# Patient Record
Sex: Male | Born: 2000 | Race: Black or African American | Hispanic: No | Marital: Single | Smoking: Never smoker
Health system: Southern US, Community
[De-identification: ages and names within clinical notes are randomized; demographics above are authoritative.]

## PROBLEM LIST (undated history)

## (undated) DIAGNOSIS — I10 Essential (primary) hypertension: Secondary | ICD-10-CM

## (undated) DIAGNOSIS — E119 Type 2 diabetes mellitus without complications: Secondary | ICD-10-CM

## (undated) HISTORY — PX: OTHER SURGICAL HISTORY: SHX169

## (undated) HISTORY — DX: Type 2 diabetes mellitus without complications: E11.9

---
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
## 2019-07-29 ENCOUNTER — Other Ambulatory Visit: Payer: Self-pay

## 2019-07-29 ENCOUNTER — Inpatient Hospital Stay: Payer: 59

## 2019-07-29 ENCOUNTER — Emergency Department: Payer: 59

## 2019-07-29 ENCOUNTER — Encounter: Payer: Self-pay | Admitting: *Deleted

## 2019-07-29 ENCOUNTER — Inpatient Hospital Stay
Admission: EM | Admit: 2019-07-29 | Discharge: 2019-08-02 | DRG: 638 | Disposition: A | Discharge status: Home / Self Care | Payer: 59 | Attending: Internal Medicine | Admitting: Internal Medicine

## 2019-07-29 DIAGNOSIS — E1110 Type 2 diabetes mellitus with ketoacidosis without coma: Secondary | ICD-10-CM | POA: Diagnosis present

## 2019-07-29 DIAGNOSIS — I160 Hypertensive urgency: Secondary | ICD-10-CM | POA: Diagnosis not present

## 2019-07-29 DIAGNOSIS — D72829 Elevated white blood cell count, unspecified: Secondary | ICD-10-CM

## 2019-07-29 DIAGNOSIS — M6282 Rhabdomyolysis: Secondary | ICD-10-CM | POA: Diagnosis present

## 2019-07-29 DIAGNOSIS — E1310 Other specified diabetes mellitus with ketoacidosis without coma: Secondary | ICD-10-CM | POA: Diagnosis not present

## 2019-07-29 DIAGNOSIS — E871 Hypo-osmolality and hyponatremia: Secondary | ICD-10-CM | POA: Diagnosis present

## 2019-07-29 DIAGNOSIS — R7989 Other specified abnormal findings of blood chemistry: Secondary | ICD-10-CM

## 2019-07-29 DIAGNOSIS — N5082 Scrotal pain: Secondary | ICD-10-CM | POA: Diagnosis not present

## 2019-07-29 DIAGNOSIS — Z20822 Contact with and (suspected) exposure to covid-19: Secondary | ICD-10-CM | POA: Diagnosis present

## 2019-07-29 DIAGNOSIS — R531 Weakness: Secondary | ICD-10-CM

## 2019-07-29 DIAGNOSIS — E860 Dehydration: Secondary | ICD-10-CM | POA: Diagnosis present

## 2019-07-29 DIAGNOSIS — Z833 Family history of diabetes mellitus: Secondary | ICD-10-CM

## 2019-07-29 DIAGNOSIS — R748 Abnormal levels of other serum enzymes: Secondary | ICD-10-CM

## 2019-07-29 DIAGNOSIS — N179 Acute kidney failure, unspecified: Secondary | ICD-10-CM | POA: Diagnosis present

## 2019-07-29 DIAGNOSIS — Z880 Allergy status to penicillin: Secondary | ICD-10-CM

## 2019-07-29 DIAGNOSIS — I10 Essential (primary) hypertension: Secondary | ICD-10-CM | POA: Diagnosis present

## 2019-07-29 DIAGNOSIS — N50819 Testicular pain, unspecified: Secondary | ICD-10-CM

## 2019-07-29 DIAGNOSIS — K760 Fatty (change of) liver, not elsewhere classified: Secondary | ICD-10-CM | POA: Diagnosis present

## 2019-07-29 DIAGNOSIS — E1010 Type 1 diabetes mellitus with ketoacidosis without coma: Secondary | ICD-10-CM | POA: Diagnosis present

## 2019-07-29 LAB — URINALYSIS, COMPLETE (UACMP) WITH MICROSCOPIC
Bacteria, UA: NONE SEEN
Bilirubin Urine: NEGATIVE
Glucose, UA: >=500 mg/dL — AB
Ketones, ur: 20 mg/dL — AB
Leukocytes,Ua: NEGATIVE
Nitrite: NEGATIVE
Protein, ur: 100 mg/dL — AB
Specific Gravity, Urine: 1.022 (ref 1.005–1.030)
Squamous Epithelial / HPF: NONE SEEN (ref 0–5)
pH: 5 (ref 5.0–8.0)

## 2019-07-29 LAB — URINE DRUG SCREEN, QUALITATIVE (ARMC ONLY)
Amphetamines, Ur Screen: NOT DETECTED
Barbiturates, Ur Screen: NOT DETECTED
Benzodiazepine, Ur Scrn: NOT DETECTED
Cannabinoid 50 Ng, Ur ~~LOC~~: NOT DETECTED
Cocaine Metabolite,Ur ~~LOC~~: NOT DETECTED
MDMA (Ecstasy)Ur Screen: NOT DETECTED
Methadone Scn, Ur: NOT DETECTED
Opiate, Ur Screen: NOT DETECTED
Phencyclidine (PCP) Ur S: NOT DETECTED
Tricyclic, Ur Screen: NOT DETECTED

## 2019-07-29 LAB — CBC
HCT: 56.9 % — ABNORMAL HIGH (ref 39.0–52.0)
Hemoglobin: 18.4 g/dL — ABNORMAL HIGH (ref 13.0–17.0)
MCH: 24.6 pg — ABNORMAL LOW (ref 26.0–34.0)
MCHC: 32.3 g/dL (ref 30.0–36.0)
MCV: 76.2 fL — ABNORMAL LOW (ref 80.0–100.0)
Platelets: 453 10*3/uL — ABNORMAL HIGH (ref 150–400)
RBC: 7.47 MIL/uL — ABNORMAL HIGH (ref 4.22–5.81)
RDW: 16.5 % — ABNORMAL HIGH (ref 11.5–15.5)
WBC: 17.9 10*3/uL — ABNORMAL HIGH (ref 4.0–10.5)
nRBC: 0 % (ref 0.0–0.2)

## 2019-07-29 LAB — BLOOD GAS, VENOUS
Acid-base deficit: 18.5 mmol/L — ABNORMAL HIGH (ref 0.0–2.0)
Bicarbonate: 9.3 mmol/L — ABNORMAL LOW (ref 20.0–28.0)
O2 Saturation: 73.8 %
Patient temperature: 37
pCO2, Ven: 28 mmHg — ABNORMAL LOW (ref 44.0–60.0)
pH, Ven: 7.13 — CL (ref 7.250–7.430)
pO2, Ven: 53 mmHg — ABNORMAL HIGH (ref 32.0–45.0)

## 2019-07-29 LAB — HEPATIC FUNCTION PANEL
ALT: 123 U/L — ABNORMAL HIGH (ref 0–44)
AST: 321 U/L — ABNORMAL HIGH (ref 15–41)
Albumin: 4.4 g/dL (ref 3.5–5.0)
Alkaline Phosphatase: 94 U/L (ref 38–126)
Bilirubin, Direct: 0.4 mg/dL — ABNORMAL HIGH (ref 0.0–0.2)
Indirect Bilirubin: 2 mg/dL — ABNORMAL HIGH (ref 0.3–0.9)
Total Bilirubin: 2.4 mg/dL — ABNORMAL HIGH (ref 0.3–1.2)
Total Protein: 8.1 g/dL (ref 6.5–8.1)

## 2019-07-29 LAB — GLUCOSE, CAPILLARY: Glucose-Capillary: >600 mg/dL (ref 70–99)

## 2019-07-29 LAB — BASIC METABOLIC PANEL
Anion gap: 32 — ABNORMAL HIGH (ref 5–15)
BUN: 36 mg/dL — ABNORMAL HIGH (ref 6–20)
CO2: 9 mmol/L — ABNORMAL LOW (ref 22–32)
Calcium: 9.7 mg/dL (ref 8.9–10.3)
Chloride: 89 mmol/L — ABNORMAL LOW (ref 98–111)
Creatinine, Ser: 2.39 mg/dL — ABNORMAL HIGH (ref 0.61–1.24)
GFR calc Af Amer: 44 mL/min — ABNORMAL LOW (ref >60)
GFR calc non Af Amer: 38 mL/min — ABNORMAL LOW (ref >60)
Glucose, Bld: 910 mg/dL (ref 70–99)
Potassium: 6 mmol/L — ABNORMAL HIGH (ref 3.5–5.1)
Sodium: 130 mmol/L — ABNORMAL LOW (ref 135–145)

## 2019-07-29 LAB — SARS CORONAVIRUS 2 BY RT PCR (HOSPITAL ORDER, PERFORMED IN ~~LOC~~ HOSPITAL LAB): SARS Coronavirus 2: NEGATIVE

## 2019-07-29 LAB — CK: Total CK: 45235 U/L — ABNORMAL HIGH (ref 49–397)

## 2019-07-29 LAB — BETA-HYDROXYBUTYRIC ACID: Beta-Hydroxybutyric Acid: >8 mmol/L — ABNORMAL HIGH (ref 0.05–0.27)

## 2019-07-29 MED ORDER — DEXTROSE-NACL 5-0.45 % IV SOLN: INTRAVENOUS | Status: DC

## 2019-07-29 MED ORDER — LACTATED RINGERS IV BOLUS
1000.0000 mL | Freq: Once | INTRAVENOUS | Status: AC
  Administered 2019-07-29: 1000 mL via INTRAVENOUS

## 2019-07-29 MED ORDER — DEXTROSE IN LACTATED RINGERS 5 % IV SOLN: INTRAVENOUS | Status: DC

## 2019-07-29 MED ORDER — LACTATED RINGERS IV SOLN: INTRAVENOUS | Status: DC

## 2019-07-29 MED ORDER — LACTATED RINGERS BOLUS PEDS
1000.0000 mL | Freq: Once | INTRAVENOUS | Status: AC
  Administered 2019-07-29: 1000 mL via INTRAVENOUS

## 2019-07-29 MED ORDER — SODIUM CHLORIDE 0.9% FLUSH: 3.0000 mL | Freq: Once | INTRAVENOUS | Status: DC

## 2019-07-29 MED ORDER — HEPARIN SODIUM (PORCINE) 5000 UNIT/ML IJ SOLN
5000.0000 [IU] | Freq: Three times a day (TID) | INTRAMUSCULAR | Status: DC
  Administered 2019-07-30 – 2019-08-02 (×11): 5000 [IU] via SUBCUTANEOUS
  Filled 2019-07-29 (×9): qty 1

## 2019-07-29 MED ORDER — STERILE WATER FOR INJECTION IV SOLN
INTRAVENOUS | Status: DC
  Filled 2019-07-29 (×2): qty 850

## 2019-07-29 MED ORDER — SODIUM CHLORIDE 0.9 % IV BOLUS
1000.0000 mL | Freq: Once | INTRAVENOUS | Status: AC
  Administered 2019-07-29: 1000 mL via INTRAVENOUS

## 2019-07-29 MED ORDER — INSULIN REGULAR(HUMAN) IN NACL 100-0.9 UT/100ML-% IV SOLN
INTRAVENOUS | Status: DC
  Administered 2019-07-29: 9.5 [IU]/h via INTRAVENOUS
  Administered 2019-07-30: 12 [IU]/h via INTRAVENOUS
  Administered 2019-07-30: 13 [IU]/h via INTRAVENOUS
  Filled 2019-07-29 (×5): qty 100

## 2019-07-29 MED ORDER — DEXTROSE 50 % IV SOLN: 0.0000 mL | INTRAVENOUS | Status: DC | PRN

## 2019-07-29 NOTE — ED Notes (Signed)
Pt taken to the bathroom for specimen.

## 2019-07-29 NOTE — ED Notes (Signed)
Pt brought from [facility] for fatigue and vomiting. Pt states he has been drinking 5 gallons of water every 2 days for 3 to 5 weeks. Heart rate is 148, sats 98%, BP 140/98.

## 2019-07-29 NOTE — H&P (Signed)
Name: Kevin Sanders MRN: [id number] DOB: March 30, 2000    ADMISSION DATE:  07/29/2019 CONSULTATION DATE:  07/29/2019  REFERRING MD :  Dr. Joan Mayans  CHIEF COMPLAINT:  Fatigue, Polyuria, Polydipsia  BRIEF PATIENT DESCRIPTION:  19 y.o. Male admitted 07/29/19 with new onset Diabetes Mellitus & DKA, AKI, and Rhabdomyolysis requiring insulin and bicarb drips.  SIGNIFICANT EVENTS  5/20: Admission to Stepdown unit  STUDIES:  5/20: RUQ Abdominal US>> 1. Increased hepatic echogenicity consistent with steatosis. The liver parenchyma is difficult to penetrate, no obvious focal abnormality. 2. Normal sonographic appearance of the gallbladder and biliary Tree. 5/20: CXR>>The cardiomediastinal contours are normal. The lungs are clear. Pulmonary vasculature is normal. No consolidation, pleural effusion, or pneumothorax. No acute osseous abnormalities are seen.  CULTURES: SARS-CoV-2 PCR 5/20>> negative  ANTIBIOTICS: N/A  HISTORY OF PRESENT ILLNESS:   Kevin Sanders is a 19 year old male with a past medical history significant for hypertension who presents to [facility] ED on 07/29/2019 due to complaints of fatigue, vomiting, polydipsia, and polyuria.  He reports his symptoms have been ongoing for the last several weeks, had progressively worsening.  Reports he has been drinking almost 5 gallons of water a day.  Patient has no known history of diabetes, mom has type 2 diabetes, and grandfather has type 1 diabetes.  Upon presentation to the ED he was noted to be tachycardic, all other vital signs within normal limits.  Initial work-up in the ED revealed glucose 910, beta hydroxybutyric acid >8, bicarb 9, potassium 6, BUN 36, creatinine 2.39, CK 45,235, sodium 130.  Venous blood gas with pH 7.13, PCO2 28, PO2 53, bicarb 9.3.  Urinalysis positive for ketones and hemoglobin, but no RBC's.  WBC 17.9, AST 321, ALT 123, direct bilirubin 0.4.  Chest x-ray without any acute cardiopulmonary disease.  Right upper  quadrant abdominal ultrasound with hepatic steatosis, otherwise normal gallbladder and biliary tree.  Work-up is consistent for DKA, therefore he received 2 L IV fluid bolus and was placed on insulin drip.  PCCM is asked to admit the patient for further work-up and treatment of new onset diabetes mellitus with DKA, AKI, and rhabdomyolysis requiring insulin and bicarb drips.  PAST MEDICAL HISTORY :   has a past medical history of Hypertension.  has a past surgical history that includes none. Prior to Admission medications   Medication Sig Start Date End Date Taking? Authorizing Provider  cetirizine (ZYRTEC) 10 MG tablet Take by mouth.    [provider]   Allergies  Allergen Reactions  . Augmentin [Amoxicillin-Pot Clavulanate]     FAMILY HISTORY:  family history is not on file. SOCIAL HISTORY:  reports that he has never smoked. He has never used smokeless tobacco. He reports that he does not drink alcohol or use drugs.   COVID-19 DISASTER DECLARATION:  FULL CONTACT PHYSICAL EXAMINATION WAS NOT POSSIBLE DUE TO TREATMENT OF COVID-19 AND  CONSERVATION OF PERSONAL PROTECTIVE EQUIPMENT, LIMITED EXAM FINDINGS INCLUDE-  Patient assessed or the symptoms described in the history of present illness.  In the context of the Global COVID-19 pandemic, which necessitated consideration that the patient might be at risk for infection with the SARS-CoV-2 virus that causes COVID-19, Institutional protocols and algorithms that pertain to the evaluation of patients at risk for COVID-19 are in a state of rapid change based on information released by regulatory bodies including the CDC and federal and state organizations. These policies and algorithms were followed during the patient's care while in hospital.  REVIEW OF SYSTEMS:  Positives in BOLD: Patient currently denies all complaints Constitutional: Negative for fever, chills, weight loss, malaise/fatigue and diaphoresis.  HENT: Negative for  hearing loss, ear pain, nosebleeds, congestion, sore throat, neck pain, tinnitus and ear discharge.   Eyes: Negative for blurred vision, double vision, photophobia, pain, discharge and redness.  Respiratory: Negative for cough, hemoptysis, sputum production, shortness of breath, wheezing and stridor.   Cardiovascular: Negative for chest pain, palpitations, orthopnea, claudication, leg swelling and PND.  Gastrointestinal: Negative for heartburn, nausea, vomiting, abdominal pain, diarrhea, constipation, blood in stool and melena.  Genitourinary: Negative for dysuria, urgency, frequency, hematuria and flank pain.  Musculoskeletal: Negative for myalgias, back pain, joint pain and falls.  Skin: Negative for itching and rash.  Neurological: Negative for dizziness, tingling, tremors, sensory change, speech change, focal weakness, seizures, loss of consciousness, weakness and headaches.  Endo/Heme/Allergies: Negative for environmental allergies and polydipsia. Does not bruise/bleed easily.  SUBJECTIVE:  Patient denies chest pain, shortness of breath, abdominal pain, nausea, vomiting, diarrhea, fever, chills Patient reports he is hungry would like to eat On room air  VITAL SIGNS: Temp:  [98 F (36.7 C)] 98 F (36.7 C) (05/20 1541) Pulse Rate:  [130-150] 137 (05/20 1900) Resp:  [24-36] 33 (05/20 1900) BP: (130-169)/(30-128) 169/128 (05/20 1900) SpO2:  [98 %] 98 % (05/20 1900) Weight:  [116.6 kg] 116.6 kg (05/20 1543)  PHYSICAL EXAMINATION: General: Acutely ill-appearing obese male, laying in bed, on room air, no acute distress Neuro: Awake, alert and oriented x4, follows commands, no focal deficits, speech clear HEENT: Atraumatic, normocephalic, neck supple, no JVD Cardiovascular: Tachycardia, regular rhythm, S1-S2, no murmurs, rubs, gallops, 2+ pulses throughout Lungs: Clear to auscultation bilaterally, even, nonlabored, normal effort Abdomen: Obese, soft, nontender, nondistended, no guarding  or rebound tenderness, bowel sounds positive x4 Musculoskeletal: Normal bulk and tone, no deformities, no edema Skin: Warm and dry.  No obvious rashes, lesions, ulcerations  Recent Labs  Lab 07/29/19 1550  NA 130*  K 6.0*  CL 89*  CO2 9*  BUN 36*  CREATININE 2.39*  GLUCOSE 910*   Recent Labs  Lab 07/29/19 1550  HGB 18.4*  HCT 56.9*  WBC 17.9*  PLT 453*   DG Chest Port 1 View  Result Date: 07/29/2019 CLINICAL DATA:  Leukocytosis with new onset diabetes and DKA. Fatigue and vomiting. EXAM: PORTABLE CHEST 1 VIEW COMPARISON:  None. FINDINGS: The cardiomediastinal contours are normal. The lungs are clear. Pulmonary vasculature is normal. No consolidation, pleural effusion, or pneumothorax. No acute osseous abnormalities are seen. IMPRESSION: Negative portable AP view of the chest. Electronically Signed   By: Narda Rutherford M.D.   On: 07/29/2019 20:08   US ABDOMEN LIMITED RUQ  Result Date: 07/29/2019 CLINICAL DATA:  19 year old with elevated LFTs. EXAM: ULTRASOUND ABDOMEN LIMITED RIGHT UPPER QUADRANT COMPARISON:  Noncontrast CT 01/17/2017 FINDINGS: Gallbladder: Physiologically distended. No gallstones or wall thickening visualized. No sonographic Murphy sign noted by sonographer. Common bile duct: Diameter: 4 mm, normal. Liver: Diffusely heterogeneous and increased hepatic parenchymal echogenicity. The liver parenchyma is difficult to penetrate. No obvious focal lesion. Portal vein is patent on color Doppler imaging with normal direction of blood flow towards the liver. Other: No right upper quadrant ascites. IMPRESSION: 1. Increased hepatic echogenicity consistent with steatosis. The liver parenchyma is difficult to penetrate, no obvious focal abnormality. 2. Normal sonographic appearance of the gallbladder and biliary tree. Electronically Signed   By: Narda Rutherford M.D.   On: 07/29/2019 18:40    ASSESSMENT / PLAN:  DKA  New onset Diabetes Mellitus -Follow DKA Protocol -Aggressive  IVF -Insulin drip -Follow BMP q4h -Once Anion gap closed and serum Bicarb >20, can convert to Long acting insulin & SSI -Consult diabetes coordinator, appreciate input -Check Hgb A1c -Check Islet cell antibody, Serum C-peptide & Insulin  AKI Rhabdomyolysis Hypertonic Hyponatremia (Corrected Na given glucose is 143) -Monitor I&O's / urinary output -Follow BMP -Ensure adequate renal perfusion -Avoid nephrotoxic agents as able -Replace electrolytes as indicated -IV fluids -Bicarb gtt -Follow CK  Leukocytosis, likely reactive -Monitor fever curve -Trend WBC's -Urinalysis and CXR both negative  Elevated LFT's H-epatic Steatosis on Abdominal Ultrasound, normal gallbladder and biliary tree -Follow LFT's     BEST PRACTICES: DISPOSITION: Stepdown GOALS OF CARE: FULL CODE VTE PROPHYLAXIS: HEPARIN SQ CONSULTS: DIABETES COORDINATOR UPDATES: UPDATED PT AND HIS MOTHER AT BEDSIDE 07/29/19  Harlon Ditty, AGACNP-BC [facility] Pager: [phone]  07/29/2019, 8:12 PM

## 2019-07-29 NOTE — ED Provider Notes (Signed)
[facility] Emergency Department Provider Note  ____________________________________________   First MD Initiated Contact with Patient 07/29/19 1645     (approximate)  I have reviewed the triage vital signs and the nursing notes.  History  Chief Complaint Fatigue    HPI HARVEER SADLER is a 19 y.o. male with history of hypertension who presents to the emergency department for fatigue, vomiting, polydipsia, polyuria.  Patient states his generalized fatigue and increased thirst and urination have been ongoing for the last several weeks, up to a month.  Constant since onset and progressively worsening.  Says he is drinking almost 5 gallons of water a day.  Over the last week he has also had intermittent vomiting, nonbloody.  Also reports some vague chest soreness since doing heavy workout 3 days ago.  This has been constant, mild to moderate in severity, no radiation, no alleviating/aggravating components.  Mom has a history of type 2 diabetes.  Patient has no known prior history of diabetes.   Past Medical Hx Past Medical History:  Diagnosis Date  . Hypertension     Problem List There are no problems to display for this patient.   Past Surgical Hx Past Surgical History:  Procedure Laterality Date  . none      Medications Prior to Admission medications   Medication Sig Start Date End Date Taking? Authorizing Provider  amLODipine (NORVASC) 10 MG tablet Take 10 mg by mouth daily. 12/31/16   [provider]  lisinopril-hydrochlorothiazide (PRINZIDE,ZESTORETIC) 20-12.5 MG tablet TAKE 1 TAB BY MOUTH ONCE A DAY 12/31/16   [provider]  sulfamethoxazole-trimethoprim (BACTRIM DS,SEPTRA DS) 800-160 MG tablet Take 1 tablet by mouth 2 (two) times daily.    [provider]    Allergies Augmentin [amoxicillin-pot clavulanate]  Family Hx Family History  Problem Relation Age of Onset  . Bladder Cancer Neg Hx   . Kidney cancer  Neg Hx   . Prostate cancer Neg Hx     Social Hx Social History   Tobacco Use  . Smoking status: Never Smoker  . Smokeless tobacco: Never Used  Substance Use Topics  . Alcohol use: No  . Drug use: No     Review of Systems  Constitutional: Negative for fever. Negative for chills.  Positive for fatigue. Eyes: Negative for visual changes. ENT: Negative for sore throat. Cardiovascular: Positive for chest pain. Respiratory: Negative for shortness of breath. Gastrointestinal: Positive for vomiting. Genitourinary: Negative for dysuria. Musculoskeletal: Negative for leg swelling. Skin: Negative for rash. Neurological: Negative for headaches. Endocrine: Positive for polyuria, polydipsia.   Physical Exam  Vital Signs: ED Triage Vitals  Enc Vitals Group     BP 07/29/19 1541 (!) 130/30     Pulse Rate 07/29/19 1541 (!) 150     Resp 07/29/19 1541 (!) 24     Temp 07/29/19 1541 98 F (36.7 C)     Temp Source 07/29/19 1541 Oral     SpO2 07/29/19 1541 98 %     Weight 07/29/19 1543 257 lb (116.6 kg)     Height --      Head Circumference --      Peak Flow --      Pain Score 07/29/19 1543 0     Pain Loc --      Pain Edu? --      Excl. in [place]? --     Constitutional: Alert and oriented.  Overweight.  Head: Normocephalic. Atraumatic. Eyes: Conjunctivae clear. Sclera anicteric. Pupils equal  and symmetric. Nose: No masses or lesions. No congestion or rhinorrhea. Mouth/Throat: Wearing mask.  MM very dry. Neck: No stridor. Trachea midline.  Cardiovascular: Tachycardic, regular rhythm. Extremities well perfused. Respiratory: Tachypneic.  Lungs CTAB. Gastrointestinal: Soft. Non-distended. Non-tender.  Genitourinary: Deferred. Musculoskeletal: No lower extremity edema. No deformities. Neurologic:  Normal speech and language. No gross focal or lateralizing neurologic deficits are appreciated.  Skin: Skin is warm, dry and intact. No rash noted. Psychiatric: Mood and affect are  appropriate for situation.  EKG  Personally reviewed and interpreted by myself.   Date: 07/29/2019 Time: 146 Rate: 1537 Rhythm: Sinus Axis: Normal Intervals: Within normal limits Sinus tachycardia No STEMI    Radiology  Personally reviewed available imaging myself.   RUQ US - IMPRESSION:  1. Increased hepatic echogenicity consistent with steatosis. The  liver parenchyma is difficult to penetrate, no obvious focal  abnormality.  2. Normal sonographic appearance of the gallbladder and biliary  tree.    Procedures  Procedure(s) performed (including critical care):  .Critical Care Performed by: Miguel Aschoff., MD Authorized by: Miguel Aschoff., MD   Critical care provider statement:    Critical care time (minutes):  45   Critical care was necessary to treat or prevent imminent or life-threatening deterioration of the following conditions:  Endocrine crisis   Critical care was time spent personally by me on the following activities:  Discussions with consultants, evaluation of patient's response to treatment, examination of patient, ordering and performing treatments and interventions, ordering and review of laboratory studies, ordering and review of radiographic studies, pulse oximetry, re-evaluation of patient's condition, obtaining history from patient or surrogate and review of old charts     Initial Impression / Assessment and Plan / MDM / ED Course  19 y.o. male who presents to the ED for 4 weeks of fatigue, polyuria, polydipsia, and 1 week of vomiting and dehydration. POC glucose on arrival greater than 600, concerning for new onset diabetes, especially DKA versus HHS.  History also concerning for potential rhabdo, as patient is complaining of muscle aches after an intense workout several days ago.  We will obtain labs, initiate fluid resuscitation, plan for insulin pending potassium level.  Labs consistent with DKA.  Labs obtained reveal leukocytosis to 17.9,  suspect reactive.  Glucose on BMP 910.  Creatinine 2.39.  Bicarb 9.  Anion gap 32.  VBG pH 7.13.  Beta hydroxybutyrate greater than 8.  Ketones in urine.  Labs also concerning for rhabdomyolysis.  Large hemoglobin in urine, no RBCs, CK 45,000.  We will initiate aggressive fluid resuscitation, insulin drip.    Other labs also reveal elevated LFTs and bilirubin, which may reactive in setting of his DKA, vomiting, dehydration, rhabdo, additionally the sample hemolyzed so may be falsely elevated.  Will resend sample, but also obtain RUQ ultrasound to rule out any potential infectious etiology as an additional precipitant for his DKA.  Discussed with ICU for admission.  Updated mother and patient at bedside on results, who are in agreement with the plan.  _______________________________   As part of my medical decision making I have reviewed available labs, radiology tests, reviewed old records/performed chart review, obtained additional history from family, mom at bedside    Final Clinical Impression(s) / ED Diagnosis  DKA Dehydration Vomiting Weakness Elevated LFTs Rhabdomyolysis Elevated CK AKI Metabolic acidosis    Note:  This document was prepared using Dragon voice recognition software and may include unintentional dictation errors.   Miguel Aschoff., MD 07/29/19 2019

## 2019-07-29 NOTE — ED Triage Notes (Signed)
Pt was seen at [facility] for not feeling well including fatigue. Pt states that he has been drinking 5 gallons of water daily. Pt reports that his chest has been hurting since he worked out 3 days ago.

## 2019-07-30 DIAGNOSIS — E1010 Type 1 diabetes mellitus with ketoacidosis without coma: Principal | ICD-10-CM

## 2019-07-30 DIAGNOSIS — M6282 Rhabdomyolysis: Secondary | ICD-10-CM

## 2019-07-30 DIAGNOSIS — E1310 Other specified diabetes mellitus with ketoacidosis without coma: Secondary | ICD-10-CM

## 2019-07-30 LAB — BASIC METABOLIC PANEL WITH GFR
Anion gap: 10 (ref 5–15)
Anion gap: 12 (ref 5–15)
Anion gap: 13 (ref 5–15)
BUN: 27 mg/dL — ABNORMAL HIGH (ref 6–20)
BUN: 24 mg/dL — ABNORMAL HIGH (ref 6–20)
BUN: 23 mg/dL — ABNORMAL HIGH (ref 6–20)
CO2: 27 mmol/L (ref 22–32)
CO2: 22 mmol/L (ref 22–32)
CO2: 21 mmol/L — ABNORMAL LOW (ref 22–32)
Calcium: 9.2 mg/dL (ref 8.9–10.3)
Calcium: 8.7 mg/dL — ABNORMAL LOW (ref 8.9–10.3)
Calcium: 8.7 mg/dL — ABNORMAL LOW (ref 8.9–10.3)
Chloride: 105 mmol/L (ref 98–111)
Chloride: 105 mmol/L (ref 98–111)
Chloride: 101 mmol/L (ref 98–111)
Creatinine, Ser: 1.3 mg/dL — ABNORMAL HIGH (ref 0.61–1.24)
Creatinine, Ser: 1.02 mg/dL (ref 0.61–1.24)
Creatinine, Ser: 1.01 mg/dL (ref 0.61–1.24)
GFR calc Af Amer: >60 mL/min
GFR calc Af Amer: >60 mL/min
GFR calc Af Amer: >60 mL/min
GFR calc non Af Amer: >60 mL/min
GFR calc non Af Amer: >60 mL/min
GFR calc non Af Amer: >60 mL/min
Glucose, Bld: 201 mg/dL — ABNORMAL HIGH (ref 70–99)
Glucose, Bld: 223 mg/dL — ABNORMAL HIGH (ref 70–99)
Glucose, Bld: 324 mg/dL — ABNORMAL HIGH (ref 70–99)
Potassium: 4 mmol/L (ref 3.5–5.1)
Potassium: 3.8 mmol/L (ref 3.5–5.1)
Potassium: 4.1 mmol/L (ref 3.5–5.1)
Sodium: 142 mmol/L (ref 135–145)
Sodium: 139 mmol/L (ref 135–145)
Sodium: 135 mmol/L (ref 135–145)

## 2019-07-30 LAB — CBC
HCT: 50.7 % (ref 39.0–52.0)
Hemoglobin: 17.8 g/dL — ABNORMAL HIGH (ref 13.0–17.0)
MCH: 25.1 pg — ABNORMAL LOW (ref 26.0–34.0)
MCHC: 35.1 g/dL (ref 30.0–36.0)
MCV: 71.4 fL — ABNORMAL LOW (ref 80.0–100.0)
Platelets: 336 10*3/uL (ref 150–400)
RBC: 7.1 MIL/uL — ABNORMAL HIGH (ref 4.22–5.81)
RDW: 16.2 % — ABNORMAL HIGH (ref 11.5–15.5)
WBC: 18.9 10*3/uL — ABNORMAL HIGH (ref 4.0–10.5)
nRBC: 0 % (ref 0.0–0.2)

## 2019-07-30 LAB — GLUCOSE, CAPILLARY
Glucose-Capillary: 164 mg/dL — ABNORMAL HIGH (ref 70–99)
Glucose-Capillary: 194 mg/dL — ABNORMAL HIGH (ref 70–99)
Glucose-Capillary: 194 mg/dL — ABNORMAL HIGH (ref 70–99)
Glucose-Capillary: 208 mg/dL — ABNORMAL HIGH (ref 70–99)
Glucose-Capillary: 210 mg/dL — ABNORMAL HIGH (ref 70–99)
Glucose-Capillary: 216 mg/dL — ABNORMAL HIGH (ref 70–99)
Glucose-Capillary: 218 mg/dL — ABNORMAL HIGH (ref 70–99)
Glucose-Capillary: 219 mg/dL — ABNORMAL HIGH (ref 70–99)
Glucose-Capillary: 269 mg/dL — ABNORMAL HIGH (ref 70–99)
Glucose-Capillary: 276 mg/dL — ABNORMAL HIGH (ref 70–99)
Glucose-Capillary: 281 mg/dL — ABNORMAL HIGH (ref 70–99)
Glucose-Capillary: 289 mg/dL — ABNORMAL HIGH (ref 70–99)
Glucose-Capillary: 297 mg/dL — ABNORMAL HIGH (ref 70–99)
Glucose-Capillary: 299 mg/dL — ABNORMAL HIGH (ref 70–99)
Glucose-Capillary: 311 mg/dL — ABNORMAL HIGH (ref 70–99)
Glucose-Capillary: 318 mg/dL — ABNORMAL HIGH (ref 70–99)
Glucose-Capillary: 345 mg/dL — ABNORMAL HIGH (ref 70–99)
Glucose-Capillary: 351 mg/dL — ABNORMAL HIGH (ref 70–99)
Glucose-Capillary: 354 mg/dL — ABNORMAL HIGH (ref 70–99)
Glucose-Capillary: 436 mg/dL — ABNORMAL HIGH (ref 70–99)
Glucose-Capillary: 456 mg/dL — ABNORMAL HIGH (ref 70–99)
Glucose-Capillary: 489 mg/dL — ABNORMAL HIGH (ref 70–99)
Glucose-Capillary: 493 mg/dL — ABNORMAL HIGH (ref 70–99)
Glucose-Capillary: 497 mg/dL — ABNORMAL HIGH (ref 70–99)
Glucose-Capillary: >600 mg/dL (ref 70–99)
Glucose-Capillary: >600 mg/dL (ref 70–99)
Glucose-Capillary: >600 mg/dL (ref 70–99)
Glucose-Capillary: >600 mg/dL (ref 70–99)

## 2019-07-30 LAB — HEPATIC FUNCTION PANEL
ALT: 105 U/L — ABNORMAL HIGH (ref 0–44)
AST: 350 U/L — ABNORMAL HIGH (ref 15–41)
Albumin: 3.6 g/dL (ref 3.5–5.0)
Alkaline Phosphatase: 74 U/L (ref 38–126)
Bilirubin, Direct: 0.3 mg/dL — ABNORMAL HIGH (ref 0.0–0.2)
Indirect Bilirubin: 1.2 mg/dL — ABNORMAL HIGH (ref 0.3–0.9)
Total Bilirubin: 1.5 mg/dL — ABNORMAL HIGH (ref 0.3–1.2)
Total Protein: 7 g/dL (ref 6.5–8.1)

## 2019-07-30 LAB — BETA-HYDROXYBUTYRIC ACID
Beta-Hydroxybutyric Acid: 1.25 mmol/L — ABNORMAL HIGH (ref 0.05–0.27)
Beta-Hydroxybutyric Acid: 3.99 mmol/L — ABNORMAL HIGH (ref 0.05–0.27)

## 2019-07-30 LAB — BASIC METABOLIC PANEL
Anion gap: 21 — ABNORMAL HIGH (ref 5–15)
Anion gap: 11 (ref 5–15)
BUN: 33 mg/dL — ABNORMAL HIGH (ref 6–20)
BUN: 22 mg/dL — ABNORMAL HIGH (ref 6–20)
CO2: 16 mmol/L — ABNORMAL LOW (ref 22–32)
CO2: 21 mmol/L — ABNORMAL LOW (ref 22–32)
Calcium: 9.2 mg/dL (ref 8.9–10.3)
Calcium: 9 mg/dL (ref 8.9–10.3)
Chloride: 102 mmol/L (ref 98–111)
Chloride: 103 mmol/L (ref 98–111)
Creatinine, Ser: 1.69 mg/dL — ABNORMAL HIGH (ref 0.61–1.24)
Creatinine, Ser: 1.08 mg/dL (ref 0.61–1.24)
GFR calc Af Amer: >60 mL/min (ref >60)
GFR calc Af Amer: >60 mL/min (ref >60)
GFR calc non Af Amer: 57 mL/min — ABNORMAL LOW (ref >60)
GFR calc non Af Amer: >60 mL/min (ref >60)
Glucose, Bld: 402 mg/dL — ABNORMAL HIGH (ref 70–99)
Glucose, Bld: 291 mg/dL — ABNORMAL HIGH (ref 70–99)
Potassium: 4.5 mmol/L (ref 3.5–5.1)
Potassium: 4.1 mmol/L (ref 3.5–5.1)
Sodium: 139 mmol/L (ref 135–145)
Sodium: 135 mmol/L (ref 135–145)

## 2019-07-30 LAB — CK: Total CK: 51785 U/L — ABNORMAL HIGH (ref 49–397)

## 2019-07-30 LAB — HEMOGLOBIN A1C
Hgb A1c MFr Bld: 11.4 % — ABNORMAL HIGH (ref 4.8–5.6)
Mean Plasma Glucose: 280 mg/dL

## 2019-07-30 LAB — MRSA PCR SCREENING: MRSA by PCR: NEGATIVE

## 2019-07-30 LAB — HIV ANTIBODY (ROUTINE TESTING W REFLEX): HIV Screen 4th Generation wRfx: NONREACTIVE

## 2019-07-30 MED ORDER — INSULIN ASPART 100 UNIT/ML ~~LOC~~ SOLN: 0.0000 [IU] | Freq: Every day | SUBCUTANEOUS | Status: DC

## 2019-07-30 MED ORDER — KETOROLAC TROMETHAMINE 30 MG/ML IJ SOLN
30.0000 mg | Freq: Once | INTRAMUSCULAR | Status: AC
  Administered 2019-07-30: 30 mg via INTRAVENOUS
  Filled 2019-07-30: qty 1

## 2019-07-30 MED ORDER — LACTATED RINGERS IV SOLN: INTRAVENOUS | Status: DC

## 2019-07-30 MED ORDER — INSULIN ASPART 100 UNIT/ML ~~LOC~~ SOLN
4.0000 [IU] | Freq: Three times a day (TID) | SUBCUTANEOUS | Status: DC
  Administered 2019-07-31: 4 [IU] via SUBCUTANEOUS
  Filled 2019-07-30: qty 1

## 2019-07-30 MED ORDER — INSULIN ASPART 100 UNIT/ML ~~LOC~~ SOLN
0.0000 [IU] | Freq: Three times a day (TID) | SUBCUTANEOUS | Status: DC
  Administered 2019-07-30: 11 [IU] via SUBCUTANEOUS
  Administered 2019-07-30: 5 [IU] via SUBCUTANEOUS
  Filled 2019-07-30 (×2): qty 1

## 2019-07-30 MED ORDER — INSULIN DETEMIR 100 UNIT/ML ~~LOC~~ SOLN
34.0000 [IU] | Freq: Every day | SUBCUTANEOUS | Status: DC
  Administered 2019-07-30: 34 [IU] via SUBCUTANEOUS
  Filled 2019-07-30 (×2): qty 0.34

## 2019-07-30 MED ORDER — LABETALOL HCL 5 MG/ML IV SOLN
10.0000 mg | INTRAVENOUS | Status: DC | PRN
  Administered 2019-07-30 – 2019-07-31 (×6): 10 mg via INTRAVENOUS
  Filled 2019-07-30 (×7): qty 4

## 2019-07-30 MED ORDER — INSULIN ASPART 100 UNIT/ML ~~LOC~~ SOLN
0.0000 [IU] | Freq: Every day | SUBCUTANEOUS | Status: DC
  Administered 2019-07-30: 2 [IU] via SUBCUTANEOUS
  Administered 2019-07-31 – 2019-08-01 (×2): 3 [IU] via SUBCUTANEOUS
  Filled 2019-07-30 (×3): qty 1

## 2019-07-30 MED ORDER — LIVING WELL WITH DIABETES BOOK
Freq: Once | Status: AC
  Filled 2019-07-30: qty 1

## 2019-07-30 MED ORDER — INSULIN DETEMIR 100 UNIT/ML ~~LOC~~ SOLN
0.3000 [IU]/kg | SUBCUTANEOUS | Status: DC
  Administered 2019-07-30: 34 [IU] via SUBCUTANEOUS
  Filled 2019-07-30 (×2): qty 0.34

## 2019-07-30 MED ORDER — KETOROLAC TROMETHAMINE 15 MG/ML IJ SOLN
15.0000 mg | Freq: Four times a day (QID) | INTRAMUSCULAR | Status: DC | PRN
  Administered 2019-07-31 – 2019-08-01 (×5): 15 mg via INTRAVENOUS
  Filled 2019-07-30 (×6): qty 1

## 2019-07-30 MED ORDER — INSULIN STARTER KIT- PEN NEEDLES (ENGLISH)
1.0000 | Freq: Once | Status: AC
  Administered 2019-07-30: 1
  Filled 2019-07-30: qty 1

## 2019-07-30 MED ORDER — INSULIN ASPART 100 UNIT/ML ~~LOC~~ SOLN
0.0000 [IU] | Freq: Three times a day (TID) | SUBCUTANEOUS | Status: DC
  Administered 2019-07-31: 8 [IU] via SUBCUTANEOUS
  Administered 2019-07-31: 11 [IU] via SUBCUTANEOUS
  Administered 2019-07-31: 8 [IU] via SUBCUTANEOUS
  Administered 2019-08-01 (×3): 5 [IU] via SUBCUTANEOUS
  Administered 2019-08-02 (×2): 3 [IU] via SUBCUTANEOUS
  Filled 2019-07-30 (×8): qty 1

## 2019-07-30 MED ORDER — CHLORHEXIDINE GLUCONATE CLOTH 2 % EX PADS: 6.0000 | MEDICATED_PAD | Freq: Every day | CUTANEOUS | Status: DC

## 2019-07-30 MED ORDER — INSULIN ASPART 100 UNIT/ML ~~LOC~~ SOLN
4.0000 [IU] | Freq: Three times a day (TID) | SUBCUTANEOUS | Status: DC
  Administered 2019-07-30: 4 [IU] via SUBCUTANEOUS
  Filled 2019-07-30: qty 1

## 2019-07-30 NOTE — Progress Notes (Signed)
Name: Kevin Sanders MRN: [id number] DOB: October 09, 2000    ADMISSION DATE:  07/29/2019 INITIAL CONSULTATION DATE:  07/29/2019  REFERRING MD :  Kevin Sanders  CHIEF COMPLAINT:  Fatigue, Polyuria, Polydipsia  BRIEF PATIENT DESCRIPTION:  19 y.o. Male admitted 07/29/19 with new onset Diabetes Mellitus & DKA, AKI, and Rhabdomyolysis requiring insulin and bicarb drips. PCCM is asked to admit the patient for further work-up and treatment of new onset diabetes mellitus with DKA, AKI, and rhabdomyolysis requiring insulin and bicarb drips.  SIGNIFICANT EVENTS  5/20: Admission to Stepdown unit 5/21: Improving DKA.   STUDIES:  5/20: RUQ Abdominal US>> 1. Increased hepatic echogenicity consistent with steatosis. The liver parenchyma is difficult to penetrate, no obvious focal abnormality. 2. Normal sonographic appearance of the gallbladder and biliary Tree. 5/20: CXR>>The cardiomediastinal contours are normal. The lungs are clear. Pulmonary vasculature is normal. No consolidation, pleural effusion, or pneumothorax. No acute osseous abnormalities are seen.  CULTURES: SARS-CoV-2 PCR 5/20>> negative  ANTIBIOTICS: N/  SUBJECTIVE:  Patient does not endorse shortness of breath, abdominal pain, nausea, vomiting, diarrhea, fever, chills He does have some pleuritic left lower chest pain. Comfortable on room air  REVIEW OF SYSTEMS:     VITAL SIGNS: Temp:  [97.9 F (36.6 C)-98.3 F (36.8 C)] 98.2 F (36.8 C) (05/21 1600) Pulse Rate:  [89-145] 128 (05/21 1900) Resp:  [19-39] 29 (05/21 1900) BP: (131-182)/(67-143) 172/108 (05/21 1900) SpO2:  [96 %-100 %] 98 % (05/21 1900) Weight:  [113.9 kg] 113.9 kg (05/21 0010)  PHYSICAL EXAMINATION: General: Obese male, laying in bed, on room air, no acute distress, no tachypnea. Neuro: Awake, alert and oriented x4, follows commands, no focal deficits, speech clear. HEENT: Atraumatic, normocephalic, neck supple, no JVD, trachea midline. Cardiovascular:  Tachycardia, regular rhythm, S1-S2, no murmurs, rubs, gallops, 2+ pulses throughout Lungs: Clear to auscultation bilaterally, even, nonlabored, normal effort Abdomen: Obese, soft, nontender, nondistended, no guarding or rebound tenderness, bowel sounds positive x4 Musculoskeletal: Normal bulk and tone, no deformities, no edema Skin: Warm and dry.  No obvious rashes, lesions, ulcerations  Recent Labs  Lab 07/30/19 0743 07/30/19 1243 07/30/19 1740  NA 139 135 135  K 3.8 4.1 4.1  CL 105 101 103  CO2 22 21* 21*  BUN 24* 23* 22*  CREATININE 1.02 1.01 1.08  GLUCOSE 223* 324* 291*   Recent Labs  Lab 07/29/19 1550 07/30/19 0419  HGB 18.4* 17.8*  HCT 56.9* 50.7  WBC 17.9* 18.9*  PLT 453* 336   DG Chest Port 1 View  Result Date: 07/29/2019 CLINICAL DATA:  Leukocytosis with new onset diabetes and DKA. Fatigue and vomiting. EXAM: PORTABLE CHEST 1 VIEW COMPARISON:  None. FINDINGS: The cardiomediastinal contours are normal. The lungs are clear. Pulmonary vasculature is normal. No consolidation, pleural effusion, or pneumothorax. No acute osseous abnormalities are seen. IMPRESSION: Negative portable AP view of the chest. Electronically Signed   By: Narda Rutherford M.D.   On: 07/29/2019 20:08   US ABDOMEN LIMITED RUQ  Result Date: 07/29/2019 CLINICAL DATA:  19 year old with elevated LFTs. EXAM: ULTRASOUND ABDOMEN LIMITED RIGHT UPPER QUADRANT COMPARISON:  Noncontrast CT 01/17/2017 FINDINGS: Gallbladder: Physiologically distended. No gallstones or wall thickening visualized. No sonographic Murphy sign noted by sonographer. Common bile duct: Diameter: 4 mm, normal. Liver: Diffusely heterogeneous and increased hepatic parenchymal echogenicity. The liver parenchyma is difficult to penetrate. No obvious focal lesion. Portal vein is patent on color Doppler imaging with normal direction of blood flow towards the liver. Other: No right upper quadrant  ascites. IMPRESSION: 1. Increased hepatic echogenicity  consistent with steatosis. The liver parenchyma is difficult to penetrate, no obvious focal abnormality. 2. Normal sonographic appearance of the gallbladder and biliary tree. Electronically Signed   By: Keith Rake M.D.   On: 07/29/2019 18:40    ASSESSMENT / PLAN:  DKA New onset Diabetes Mellitus -Follow DKA Protocol -Aggressive IVF -Insulin drip was initially transitioned off but has had to restart -Follow BMP q4h -Once Anion gap closed and serum Bicarb >20, can convert to Long acting insulin & SSI -Consulted diabetes coordinator, appreciate input -Check Hgb A1c - Islet cell antibody, Serum C-peptide & Insulin, pending  AKI Rhabdomyolysis Hypertonic Hyponatremia -resolved -Monitor I&O's / urinary output -Follow BMP -Avoid nephrotoxic agents as able -Replace electrolytes as indicated -IV fluids -Discontinue bicarb infusion -Follow CK  Leukocytosis, likely reactive -Monitor fever curve -Trend WBC's -Urinalysis and CXR both negative You   Transaminitis Hepatic Steatosis on Abdominal Ultrasound, normal gallbladder and biliary tree Trend LFT's Due to acute illness  Pleuritic chest pain Possible pleurisy Negative chest x-ray Toradol, as needed   BEST PRACTICES: DISPOSITION: Stepdown GOALS OF CARE: FULL CODE VTE PROPHYLAXIS: HEPARIN SQ CONSULTS: DIABETES COORDINATOR UPDATES: UPDATED PT AND HIS MOTHER AT BEDSIDE 07/30/19    Discussed during multidisciplinary rounds.  Patient may be transferred to the hospitalist service.   Renold Don, MD [facility] 07/30/2019, 8:46 PM    *This note was dictated using voice recognition software/Dragon.  Despite best efforts to proofread, errors can occur which can change the meaning.  Any change was purely unintentional.

## 2019-07-30 NOTE — Progress Notes (Signed)
Education done regarding application and changing CGM sensor (alternate every 14 days on back of arms), 1 hour warm-up, use of glucometer/where to buy strips, how to scan CGM for glucose reading and information for PCP. Patient has been given [employer] reader and first sensor for use. Patient has also been given educational packet regarding use CGM sensor including the 1-800 toll free number for any questions, problems or needs related to the [facility] sensors or reader.  Patient to be given Rx. For sensors with prescriptions/discharge paper work.  Sensor applied by patient to (R) Arm at (3:30p).  Explained that glucose readings will not be available until 1 hours after application. Patient verbalizes understanding of use of Freestyle Libre CGM and was told that any issues with blood sugars/diabetes will need to be addressed by PCP.    Also reviewed hypoglycemia, signs, symptoms and treatment.  Patient was able to teach back.   Thanks,  Beryl Meager, RN, BC-ADM Inpatient Diabetes Coordinator Pager [phone] (8a-5p)

## 2019-07-30 NOTE — Progress Notes (Signed)
Patient blood sugar was 354 after discontinuing Insulin drip and utilizing fast acting medium sensitivity sliding scale.  Physician decided to place patient back on insulin drip.

## 2019-07-30 NOTE — Progress Notes (Signed)
Inpatient Diabetes Program Recommendations  AACE/ADA: New Consensus Statement on Inpatient Glycemic Control (2015)  Target Ranges:  Prepandial:   less than 140 mg/dL      Peak postprandial:   less than 180 mg/dL (1-2 hours)      Critically ill patients:  140 - 180 mg/dL   Lab Results  Component Value Date   GLUCAP 210 (H) 07/30/2019    Review of Glycemic Control Results for ROMIN, DIVITA (MRN [id number]) as of 07/30/2019 07:08  Ref. Range 07/30/2019 04:28 07/30/2019 05:33 07/30/2019 06:29  Glucose-Capillary Latest Ref Range: 70 - 99 mg/dL 351 (H) 194 (H) 210 (H)   Diabetes history: New Dx. Of DM Outpatient Diabetes medications: None Current orders for Inpatient glycemic control:  IV insulin/DKA order set  Inpatient Diabetes Program Recommendations:   Once acidosis cleared and patient ready for transition off insulin drip, consider Levemir 34 units 2 hours prior to d/c of insulin drip.  Also consider Novolog moderate correction tid with meals and HS plus Novolog meal coverage 4 units tid with meals.   Note new diagnosis.  Ordered LWWD booklet, insulin starter kit, and  Dietician consult.  Consider checking C-peptide to determine if patient is making insulin? A1C pending. Will see patient today.   Thanks  Adah Perl, RN, BC-ADM Inpatient Diabetes Coordinator Pager [phone]

## 2019-07-30 NOTE — Plan of Care (Signed)
  RD consulted for nutrition education regarding diabetes.   Lab Results  Component Value Date   HGBA1C 11.4 (H) 07/29/2019   Met with patient and his mother at bedside. Patient reports he typically skips breakfast and lunch and eats one larger meal at dinner. He may have pizza or other fast food. He drinks Cheerwine during the day at work. Discussed limiting sugar-sweetened beverages. Discussed other beverage options patient may enjoy. Encouraged consistent intake throughout the day and discouraged patient from skipping meals.  RD provided "Carbohydrate Counting for People with Diabetes" handout from the Academy of Nutrition and Dietetics. Discussed different food groups and their effects on blood sugar, emphasizing carbohydrate-containing foods. Provided list of carbohydrates and recommended serving sizes of common foods.  Discussed importance of controlled and consistent carbohydrate intake throughout the day. Provided examples of ways to balance meals/snacks and encouraged intake of high-fiber, whole grain complex carbohydrates. Teach back method used.  Expect fair to good compliance.  Body mass index is 37.08 kg/m. Pt meets criteria for obesity class II based on current BMI.  Current diet order is carbohydrate modified, patient is consuming approximately 75% of meals at this time. Labs and medications reviewed. No further nutrition interventions warranted at this time. RD contact information provided. If additional nutrition issues arise, please re-consult RD.  Jacklynn Barnacle, MS, RD, LDN Pager number available on Amion

## 2019-07-30 NOTE — Progress Notes (Signed)
Patient AOx4.  His B/P 176/89 (110)  HR 110.  His insulin drip was stopped around 0840.  He was delivered 5 units Novalog per sliding scale and he also received 34 units Levemir around 0630 (basal control).  Patient dextrose fluid changed to LR @ 177ml/hr.  He is awake and in bed eating.

## 2019-07-30 NOTE — Progress Notes (Signed)
Inpatient Diabetes Program Recommendations  AACE/ADA: New Consensus Statement on Inpatient Glycemic Control (2015)  Target Ranges:  Prepandial:   less than 140 mg/dL      Peak postprandial:   less than 180 mg/dL (1-2 hours)      Critically ill patients:  140 - 180 mg/dL   Lab Results  Component Value Date   GLUCAP 345 (H) 07/30/2019   HGBA1C 11.4 (H) 07/29/2019    Review of Glycemic Control Results for Kevin Sanders, Kevin Sanders (MRN [id number]) as of 07/30/2019 12:41  Ref. Range 07/30/2019 05:33 07/30/2019 06:29 07/30/2019 07:27 07/30/2019 08:46 07/30/2019 11:42  Glucose-Capillary Latest Ref Range: 70 - 99 mg/dL 194 (H) 210 (H) 218 (H) 208 (H) 345 (H)     Spoke with patient and mother regarding new diagnosis of DM.  Mother states that she has DM and was recently put on insulin.  She states that she is unsure if she has Type 1 or Type 2, although she only takes Lantus and Metformin.  Explained the role of insulin in getting glucose into the cells as well and how the body breaks down muscle/fat when insulin/glucose is not available. C-peptide pending.  Reviewed normal blood sugar values 80-130 mg/dL and patient's A1C along with goal A1C of 7%.  Patient is interested in [facility] for glucose monitoring.  MD states that Diabetes coordinator can apply CGM sensor to patient for monitoring as well.     Reviewed basic diet information including eliminating sugar from beverages.  We also discussed how carbohydrates raise blood sugars and possible food CHO sources including potatoes, breads, chips, etc. Also Encouraged patient to not skip meals.  Patient lives with grandparents and works at [employer].  Mom states that she plans to get him appt. At [facility] practice.  They are also interested in list of local endocrinologist.       Educated patient and mom on insulin pen use at home. Mom is familiar with insulin pen b/c she uses one.  Reviewed all steps if insulin pen including attachment of  needle, 2-unit air shot, dialing up dose, giving injection, removing needle, disposal of sharps, storage of unused insulin, disposal of insulin etc.  Patient able to provide successful return demonstration.  Also reviewed troubleshooting with insulin pen.  MD to give patient Rxs for insulin pens and insulin pen needles.  Based on patient's insurance [facility]), it appears that Levemir is not covered.  Please switch patient to Lantus 34 units daily.  Also Humalog is preferred rapid acting insulin.   At d/c patient will need rx for: -Lantus Solostar pen [phone] -Humalog Claiborne Rigg #[id number] -Insulin pen needles #[id number] -Glucose monitoring kit #[id number] -Freestyle Libre sensors (2 per month-He already has reader)- [phone]  Also will place order for outpatient DM education per protocol/Co-sign required.   Thanks,  Adah Perl, RN, BC-ADM Inpatient Diabetes Coordinator Pager [phone] (8a-5p)

## 2019-07-30 NOTE — Progress Notes (Signed)
[facility] hospitalist service acceptance note  19 year old male admitted directly to intensive care unit on 07/29/2019 for diabetic ketoacidosis, acute kidney injury, rhabdomyolysis.  He required insulin and bicarbonate drips.  His anion gap has been closing and rhabdomyolysis resolving.  Trend in diagnostic indices has been reassuring.  Case has been discussed with Dr. Jayme Cloud of PCCM.  Patient will be ready for transfer to the hospitalist service on 07/31/2019.  [facility] service will assume primary care of this patient on 07/31/2019.  Lolita Patella MD

## 2019-07-31 ENCOUNTER — Inpatient Hospital Stay: Payer: 59

## 2019-07-31 LAB — GLUCOSE, CAPILLARY
Glucose-Capillary: 277 mg/dL — ABNORMAL HIGH (ref 70–99)
Glucose-Capillary: 280 mg/dL — ABNORMAL HIGH (ref 70–99)
Glucose-Capillary: 321 mg/dL — ABNORMAL HIGH (ref 70–99)
Glucose-Capillary: 298 mg/dL — ABNORMAL HIGH (ref 70–99)
Glucose-Capillary: 297 mg/dL — ABNORMAL HIGH (ref 70–99)

## 2019-07-31 LAB — TROPONIN I (HIGH SENSITIVITY)
Troponin I (High Sensitivity): 93 ng/L — ABNORMAL HIGH (ref <18)
Troponin I (High Sensitivity): 85 ng/L — ABNORMAL HIGH (ref <18)

## 2019-07-31 LAB — BASIC METABOLIC PANEL
Anion gap: 13 (ref 5–15)
BUN: 23 mg/dL — ABNORMAL HIGH (ref 6–20)
CO2: 22 mmol/L (ref 22–32)
Calcium: 8.6 mg/dL — ABNORMAL LOW (ref 8.9–10.3)
Chloride: 98 mmol/L (ref 98–111)
Creatinine, Ser: 0.97 mg/dL (ref 0.61–1.24)
GFR calc Af Amer: >60 mL/min (ref >60)
GFR calc non Af Amer: >60 mL/min (ref >60)
Glucose, Bld: 291 mg/dL — ABNORMAL HIGH (ref 70–99)
Potassium: 3.5 mmol/L (ref 3.5–5.1)
Sodium: 133 mmol/L — ABNORMAL LOW (ref 135–145)

## 2019-07-31 LAB — CBC WITH DIFFERENTIAL/PLATELET
Abs Immature Granulocytes: 0.04 10*3/uL (ref 0.00–0.07)
Basophils Absolute: 0 10*3/uL (ref 0.0–0.1)
Basophils Relative: 0 %
Eosinophils Absolute: 0.1 10*3/uL (ref 0.0–0.5)
Eosinophils Relative: 1 %
HCT: 41.8 % (ref 39.0–52.0)
Hemoglobin: 14 g/dL (ref 13.0–17.0)
Immature Granulocytes: 0 %
Lymphocytes Relative: 15 %
Lymphs Abs: 1.6 10*3/uL (ref 0.7–4.0)
MCH: 24.8 pg — ABNORMAL LOW (ref 26.0–34.0)
MCHC: 33.5 g/dL (ref 30.0–36.0)
MCV: 74 fL — ABNORMAL LOW (ref 80.0–100.0)
Monocytes Absolute: 0.8 10*3/uL (ref 0.1–1.0)
Monocytes Relative: 8 %
Neutro Abs: 7.9 10*3/uL — ABNORMAL HIGH (ref 1.7–7.7)
Neutrophils Relative %: 76 %
Platelets: 206 10*3/uL (ref 150–400)
RBC: 5.65 MIL/uL (ref 4.22–5.81)
RDW: 14.3 % (ref 11.5–15.5)
WBC: 10.4 10*3/uL (ref 4.0–10.5)
nRBC: 0 % (ref 0.0–0.2)

## 2019-07-31 LAB — PHOSPHORUS: Phosphorus: 2.2 mg/dL — ABNORMAL LOW (ref 2.5–4.6)

## 2019-07-31 LAB — CK: Total CK: 38179 U/L — ABNORMAL HIGH (ref 49–397)

## 2019-07-31 LAB — INSULIN AND C-PEPTIDE, SERUM
C-Peptide: 0.3 ng/mL — ABNORMAL LOW (ref 1.1–4.4)
Insulin: 56.3 u[IU]/mL — ABNORMAL HIGH (ref 2.6–24.9)

## 2019-07-31 LAB — MAGNESIUM: Magnesium: 1.9 mg/dL (ref 1.7–2.4)

## 2019-07-31 LAB — LIPASE, BLOOD: Lipase: 44 U/L (ref 11–51)

## 2019-07-31 MED ORDER — INSULIN ASPART 100 UNIT/ML ~~LOC~~ SOLN
8.0000 [IU] | Freq: Three times a day (TID) | SUBCUTANEOUS | Status: DC
  Administered 2019-07-31 – 2019-08-01 (×4): 8 [IU] via SUBCUTANEOUS
  Filled 2019-07-31 (×4): qty 1

## 2019-07-31 MED ORDER — INSULIN GLARGINE 100 UNIT/ML ~~LOC~~ SOLN
40.0000 [IU] | Freq: Two times a day (BID) | SUBCUTANEOUS | Status: DC
  Administered 2019-07-31 (×2): 40 [IU] via SUBCUTANEOUS
  Filled 2019-07-31 (×4): qty 0.4

## 2019-07-31 MED ORDER — AMLODIPINE BESYLATE 10 MG PO TABS
10.0000 mg | ORAL_TABLET | Freq: Every day | ORAL | Status: DC
  Administered 2019-07-31 – 2019-08-02 (×3): 10 mg via ORAL
  Filled 2019-07-31 (×3): qty 1

## 2019-07-31 MED ORDER — SODIUM CHLORIDE 0.9 % IV SOLN: INTRAVENOUS | Status: DC

## 2019-07-31 MED ORDER — MORPHINE SULFATE (PF) 2 MG/ML IV SOLN: 2.0000 mg | INTRAVENOUS | Status: DC | PRN

## 2019-07-31 MED ORDER — LIDOCAINE 5 % EX PTCH
1.0000 | MEDICATED_PATCH | CUTANEOUS | Status: DC
  Filled 2019-07-31 (×3): qty 1

## 2019-07-31 MED ORDER — LOSARTAN POTASSIUM 50 MG PO TABS
25.0000 mg | ORAL_TABLET | Freq: Every day | ORAL | Status: DC
  Administered 2019-07-31: 25 mg via ORAL
  Filled 2019-07-31: qty 0.5

## 2019-07-31 MED ORDER — POTASSIUM CHLORIDE CRYS ER 20 MEQ PO TBCR
40.0000 meq | EXTENDED_RELEASE_TABLET | Freq: Once | ORAL | Status: AC
  Administered 2019-07-31: 40 meq via ORAL
  Filled 2019-07-31: qty 2

## 2019-07-31 MED ORDER — HYDROMORPHONE HCL 1 MG/ML IJ SOLN
1.0000 mg | INTRAMUSCULAR | Status: AC
  Administered 2019-07-31: 1 mg via INTRAVENOUS
  Filled 2019-07-31: qty 1

## 2019-07-31 MED ORDER — INSULIN GLARGINE 100 UNIT/ML ~~LOC~~ SOLN: 40.0000 [IU] | Freq: Every day | SUBCUTANEOUS | Status: DC

## 2019-07-31 MED ORDER — HYDROCHLOROTHIAZIDE 25 MG PO TABS
25.0000 mg | ORAL_TABLET | Freq: Every day | ORAL | Status: DC
  Administered 2019-07-31 – 2019-08-02 (×3): 25 mg via ORAL
  Filled 2019-07-31 (×3): qty 1

## 2019-07-31 NOTE — Progress Notes (Signed)
PROGRESS NOTE    Kevin Sanders  DPO:[id number] DOB: [birth year] DOA: 07/29/2019 PCP: [facility]  Brief Narrative:  18 year old male admitted directly to intensive care unit on 07/29/2019 for diabetic ketoacidosis, acute kidney injury, rhabdomyolysis.  He required insulin and bicarbonate drips.  His anion gap has been closing and rhabdomyolysis resolving.  Trend in diagnostic indices has been reassuring.  Case has been discussed with Dr. Patsey Berthold of PCCM.  Patient will be ready for transfer to the hospitalist service on 07/31/2019.  5/22: Patient seen and examined. Patient's father at bedside. DKA is resolved. Patient appears to be tolerating p.o. intake. Sugars better controlled. Blood pressure remains significantly elevated. Patient himself overall asymptomatic. Does endorse some left-sided abdominal pain. Serum CK is downtrending.   Assessment & Plan:   Active Problems:   DKA (diabetic ketoacidoses) (HCC)  Diabetic ketoacidosis New diagnosis diabetes mellitus, likely type I Suspect patient is a new diagnosis of type 1 diabetes Regardless he will be insulin-dependent for now I had lengthy discussion with both patient and patient's father at bedside All questions have been answered Diabetes coordinator following as well, recommendations appreciated Plan: No further need for Endo tool Lantus 40 units twice daily NovoLog 8 units 3 times daily with meals Moderate sliding scale coverage Nightly coverage Carb controlled diet Diabetes coordinator consult Patient will benefit from outpatient referral to endocrinology and PCP  Hypertensive urgency Patient with significant elevation in his blood pressure Adding amlodipine 10 mg daily And hydrochlorothiazide 25 mg daily Suspect patient will need a third option, anticipate addition of losartan Will continue to monitor titrate blood pressure regimen  Rhabdomyolysis Significant elevation in serum CK Downtrending No renal  insufficiency or hematuria reported Plan: Aggressive normal saline maintenance fluids Trend CK  Acute kidney injury, resolved Hypotonic hyponatremia Metabolic indices improving No indication for bicarb infusion Maintenance infusion of normal saline  Hepatic steatosis Noted on abdominal ultrasound Follow LFTs     DVT prophylaxis: Lovenox  code Status: Full code Family Communication: Father at bedside  disposition Plan: Status is: Inpatient  Remains inpatient appropriate because:Inpatient level of care appropriate due to severity of illness   Dispo: The patient is from: Home              Anticipated d/c is to: Home              Anticipated d/c date is: 2 days              Patient currently is not medically stable to d/c.  DKA resolved. Continue to titrate insulin regimen. Still with hypertensive urgency. Titrating blood pressure regimen.   Consultants:   none  Procedures:   none  Antimicrobials:   none   Subjective: Seen and examined. No acute overnight events. No new complaints.  Objective: Vitals:   07/31/19 0900 07/31/19 0907 07/31/19 1000 07/31/19 1100  BP: (!) 192/118 (!) 192/118 (!) 171/107 (!) 195/124  Pulse: (!) 103  97 93  Resp: 17  (!) 31 (!) 23  Temp:      TempSrc:      SpO2: 98%  97% 100%  Weight:      Height:        Intake/Output Summary (Last 24 hours) at 07/31/2019 1414 Last data filed at 07/31/2019 1022 Gross per 24 hour  Intake 1581.66 ml  Output 3300 ml  Net -1718.34 ml   Filed Weights   07/29/19 1543 07/30/19 0010  Weight: 116.6 kg 113.9 kg    Examination:  General  exam: Appears calm and comfortable  Respiratory system: Clear to auscultation. Respiratory effort normal. Cardiovascular system: S1 & S2 heard, RRR. No JVD, murmurs, rubs, gallops or clicks. No pedal edema. Gastrointestinal system: Abdomen is nondistended, soft and nontender. No organomegaly or masses felt. Normal bowel sounds heard. Central nervous system:  Alert and oriented. No focal neurological deficits. Extremities: Symmetric 5 x 5 power. Skin: No rashes, lesions or ulcers Psychiatry: Judgement and insight appear normal. Mood & affect appropriate.     Data Reviewed: I have personally reviewed following labs and imaging studies  CBC: Recent Labs  Lab 07/29/19 1550 07/30/19 0419 07/31/19 0516  WBC 17.9* 18.9* 10.4  NEUTROABS  --   --  7.9*  HGB 18.4* 17.8* 14.0  HCT 56.9* 50.7 41.8  MCV 76.2* 71.4* 74.0*  PLT 453* 336 206   Basic Metabolic Panel: Recent Labs  Lab 07/30/19 0419 07/30/19 0743 07/30/19 1243 07/30/19 1740 07/31/19 0516  NA 142 139 135 135 133*  K 4.0 3.8 4.1 4.1 3.5  CL 105 105 101 103 98  CO2 27 22 21* 21* 22  GLUCOSE 201* 223* 324* 291* 291*  BUN 27* 24* 23* 22* 23*  CREATININE 1.30* 1.02 1.01 1.08 0.97  CALCIUM 9.2 8.7* 8.7* 9.0 8.6*  MG  --   --   --   --  1.9  PHOS  --   --   --   --  2.2*   GFR: Estimated Creatinine Clearance: 151.2 mL/min (by C-G formula based on SCr of 0.97 mg/dL). Liver Function Tests: Recent Labs  Lab 07/29/19 1651 07/30/19 0419  AST 321* 350*  ALT 123* 105*  ALKPHOS 94 74  BILITOT 2.4* 1.5*  PROT 8.1 7.0  ALBUMIN 4.4 3.6   Recent Labs  Lab 07/31/19 0516  LIPASE 44   No results for input(s): AMMONIA in the last 168 hours. Coagulation Profile: No results for input(s): INR, PROTIME in the last 168 hours. Cardiac Enzymes: Recent Labs  Lab 07/29/19 1651 07/30/19 0419 07/31/19 0516  CKTOTAL 45,235* 51,785* 38,179*   BNP (last 3 results) No results for input(s): PROBNP in the last 8760 hours. HbA1C: Recent Labs    07/29/19 1651  HGBA1C 11.4*   CBG: Recent Labs  Lab 07/30/19 2205 07/30/19 2310 07/31/19 0417 07/31/19 0738 07/31/19 1149  GLUCAP 216* 194* 277* 280* 321*   Lipid Profile: No results for input(s): CHOL, HDL, LDLCALC, TRIG, CHOLHDL, LDLDIRECT in the last 72 hours. Thyroid Function Tests: No results for input(s): TSH, T4TOTAL, FREET4,  T3FREE, THYROIDAB in the last 72 hours. Anemia Panel: No results for input(s): VITAMINB12, FOLATE, FERRITIN, TIBC, IRON, RETICCTPCT in the last 72 hours. Sepsis Labs: No results for input(s): PROCALCITON, LATICACIDVEN in the last 168 hours.  Recent Results (from the past 240 hour(s))  SARS Coronavirus 2 by RT PCR (hospital order, performed in [facility] hospital lab) Nasopharyngeal Nasopharyngeal Swab     Status: None   Collection Time: 07/29/19  5:05 PM   Specimen: Nasopharyngeal Swab  Result Value Ref Range Status   SARS Coronavirus 2 NEGATIVE NEGATIVE Final    Comment: (NOTE) SARS-CoV-2 target nucleic acids are NOT DETECTED. The SARS-CoV-2 RNA is generally detectable in upper and lower respiratory specimens during the acute phase of infection. The lowest concentration of SARS-CoV-2 viral copies this assay can detect is 250 copies / mL. A negative result does not preclude SARS-CoV-2 infection and should not be used as the sole basis for treatment or other patient management decisions.  A negative result may occur with improper specimen collection / handling, submission of specimen other than nasopharyngeal swab, presence of viral mutation(s) within the areas targeted by this assay, and inadequate number of viral copies (<250 copies / mL). A negative result must be combined with clinical observations, patient history, and epidemiological information. Fact Sheet for Patients:   BoilerBrush.com.cy Fact Sheet for Healthcare Providers: https://pope.com/ This test is not yet approved or cleared  by the [place] FDA and has been authorized for detection and/or diagnosis of SARS-CoV-2 by FDA under an Emergency Use Authorization (EUA).  This EUA will remain in effect (meaning this test can be used) for the duration of the COVID-19 declaration under Section 564(b)(1) of the Act, 21 U.S.C. section 360bbb-3(b)(1), unless the authorization  is terminated or revoked sooner. Performed at [facility], [address] [place], [place] [address]   MRSA PCR Screening     Status: None   Collection Time: 07/30/19 12:12 AM   Specimen: Nasopharyngeal  Result Value Ref Range Status   MRSA by PCR NEGATIVE NEGATIVE Final    Comment:        The GeneXpert MRSA Assay (FDA approved for NASAL specimens only), is one component of a comprehensive MRSA colonization surveillance program. It is not intended to diagnose MRSA infection nor to guide or monitor treatment for MRSA infections. Performed at [facility], [address], [place], [place] [address]          Radiology Studies: [facility] Chest [place] 1 View  Result Date: 07/29/2019 CLINICAL DATA:  Leukocytosis with new onset diabetes and DKA. Fatigue and vomiting. EXAM: PORTABLE CHEST 1 VIEW COMPARISON:  None. FINDINGS: The cardiomediastinal contours are normal. The lungs are clear. Pulmonary vasculature is normal. No consolidation, pleural effusion, or pneumothorax. No acute osseous abnormalities are seen. IMPRESSION: Negative portable AP view of the chest. Electronically Signed   By: Narda Rutherford M.D.   On: 07/29/2019 20:08   US ABDOMEN LIMITED RUQ  Result Date: 07/29/2019 CLINICAL DATA:  19 year old with elevated LFTs. EXAM: ULTRASOUND ABDOMEN LIMITED RIGHT UPPER QUADRANT COMPARISON:  Noncontrast CT 01/17/2017 FINDINGS: Gallbladder: Physiologically distended. No gallstones or wall thickening visualized. No sonographic Murphy sign noted by sonographer. Common bile duct: Diameter: 4 mm, normal. Liver: Diffusely heterogeneous and increased hepatic parenchymal echogenicity. The liver parenchyma is difficult to penetrate. No obvious focal lesion. Portal vein is patent on color Doppler imaging with normal direction of blood flow towards the liver. Other: No right upper quadrant ascites. IMPRESSION: 1. Increased hepatic echogenicity consistent with steatosis. The liver  parenchyma is difficult to penetrate, no obvious focal abnormality. 2. Normal sonographic appearance of the gallbladder and biliary tree. Electronically Signed   By: Narda Rutherford M.D.   On: 07/29/2019 18:40        Scheduled Meds: . amLODipine  10 mg Oral Daily  . Chlorhexidine Gluconate Cloth  6 each Topical Daily  . heparin  5,000 Units Subcutaneous Q8H  . hydrochlorothiazide  25 mg Oral Daily  . insulin aspart  0-15 Units Subcutaneous TID WC  . insulin aspart  0-5 Units Subcutaneous QHS  . insulin aspart  8 Units Subcutaneous TID WC  . insulin glargine  40 Units Subcutaneous BID  . lidocaine  1 patch Transdermal Q24H  . sodium chloride flush  3 mL Intravenous Once   Continuous Infusions: . sodium chloride 125 mL/hr at 07/31/19 0906     LOS: 2 days    Time spent: 35 minutes  Tresa Moore, MD [facility] Pager [phone]  If 7PM-7AM, please contact night-coverage 07/31/2019, 2:14 PM

## 2019-07-31 NOTE — Progress Notes (Addendum)
Inpatient Diabetes Program Recommendations  AACE/ADA: New Consensus Statement on Inpatient Glycemic Control (2015)  Target Ranges:  Prepandial:   less than 140 mg/dL      Peak postprandial:   less than 180 mg/dL (1-2 hours)      Critically ill patients:  140 - 180 mg/dL   Lab Results  Component Value Date   GLUCAP 280 (H) 07/31/2019   HGBA1C 11.4 (H) 07/29/2019    Review of Glycemic Control Results for PHAT, DALTON (MRN [id number]) as of 07/31/2019 10:36  Ref. Range 07/30/2019 22:05 07/30/2019 23:10 07/31/2019 04:17 07/31/2019 07:38  Glucose-Capillary Latest Ref Range: 70 - 99 mg/dL 599 (H) 357 (H) 017 (H) 280 (H)   Diabetes history: New diagnosis of DM Outpatient Diabetes medications: None Current orders for Inpatient glycemic control:  Levemir 34 units q HS Novolog moderate tid with meals and HS Novolog 4 units tid with meals  Inpatient Diabetes Program Recommendations:    Please consider d/c of Levemir and add Lantus 40 units bid (patient received 2 doses of Levemir 34 units on 5/21 and fasting was 280 mg/dL this AM).  Also consider increasing Meal coverage to 8 units tid with meals.  Will call patient today to see if he has questions.  Note that c-peptide is very low as well.  Please see progress note from 5/21 to see order numbers for d/c medications that are covered by patient's insurance.   Thanks  Beryl Meager, RN, BC-ADM Inpatient Diabetes Coordinator Pager [phone] Spoke to patient by phone. He states that he and his dad have used the sensor but he thinks that he prefers finger sticks.  He complains of severe pain in his side?  Notified MD and RN.  States he doesn't feel like talking further at this time.

## 2019-08-01 LAB — COMPREHENSIVE METABOLIC PANEL
ALT: 91 U/L — ABNORMAL HIGH (ref 0–44)
AST: 261 U/L — ABNORMAL HIGH (ref 15–41)
Albumin: 3 g/dL — ABNORMAL LOW (ref 3.5–5.0)
Alkaline Phosphatase: 54 U/L (ref 38–126)
Anion gap: 11 (ref 5–15)
BUN: 18 mg/dL (ref 6–20)
CO2: 25 mmol/L (ref 22–32)
Calcium: 8.7 mg/dL — ABNORMAL LOW (ref 8.9–10.3)
Chloride: 98 mmol/L (ref 98–111)
Creatinine, Ser: 0.92 mg/dL (ref 0.61–1.24)
GFR calc Af Amer: >60 mL/min (ref >60)
GFR calc non Af Amer: >60 mL/min (ref >60)
Glucose, Bld: 241 mg/dL — ABNORMAL HIGH (ref 70–99)
Potassium: 3.3 mmol/L — ABNORMAL LOW (ref 3.5–5.1)
Sodium: 134 mmol/L — ABNORMAL LOW (ref 135–145)
Total Bilirubin: 1.3 mg/dL — ABNORMAL HIGH (ref 0.3–1.2)
Total Protein: 5.9 g/dL — ABNORMAL LOW (ref 6.5–8.1)

## 2019-08-01 LAB — GLUCOSE, CAPILLARY
Glucose-Capillary: 215 mg/dL — ABNORMAL HIGH (ref 70–99)
Glucose-Capillary: 234 mg/dL — ABNORMAL HIGH (ref 70–99)
Glucose-Capillary: 232 mg/dL — ABNORMAL HIGH (ref 70–99)
Glucose-Capillary: 265 mg/dL — ABNORMAL HIGH (ref 70–99)

## 2019-08-01 LAB — URINALYSIS, COMPLETE (UACMP) WITH MICROSCOPIC
Bacteria, UA: NONE SEEN
Bilirubin Urine: NEGATIVE
Glucose, UA: >=500 mg/dL — AB
Ketones, ur: 5 mg/dL — AB
Leukocytes,Ua: NEGATIVE
Nitrite: NEGATIVE
Protein, ur: 30 mg/dL — AB
Specific Gravity, Urine: 1.015 (ref 1.005–1.030)
pH: 5 (ref 5.0–8.0)

## 2019-08-01 LAB — LIPID PANEL
Cholesterol: 252 mg/dL — ABNORMAL HIGH (ref 0–200)
HDL: 30 mg/dL — ABNORMAL LOW
LDL Cholesterol: UNDETERMINED mg/dL (ref 0–99)
Total CHOL/HDL Ratio: 8.4 ratio
Triglycerides: 642 mg/dL — ABNORMAL HIGH
VLDL: UNDETERMINED mg/dL (ref 0–40)

## 2019-08-01 LAB — CHLAMYDIA/NGC RT PCR (ARMC ONLY)
Chlamydia Tr: NOT DETECTED
N gonorrhoeae: NOT DETECTED

## 2019-08-01 LAB — CK: Total CK: 18389 U/L — ABNORMAL HIGH (ref 49–397)

## 2019-08-01 LAB — TROPONIN I (HIGH SENSITIVITY): Troponin I (High Sensitivity): 64 ng/L — ABNORMAL HIGH (ref <18)

## 2019-08-01 LAB — MAGNESIUM: Magnesium: 1.9 mg/dL (ref 1.7–2.4)

## 2019-08-01 LAB — LDL CHOLESTEROL, DIRECT: Direct LDL: 104.9 mg/dL — ABNORMAL HIGH (ref 0–99)

## 2019-08-01 MED ORDER — OXYCODONE HCL 5 MG PO TABS
5.0000 mg | ORAL_TABLET | Freq: Four times a day (QID) | ORAL | Status: DC | PRN
  Administered 2019-08-01: 5 mg via ORAL
  Filled 2019-08-01: qty 1

## 2019-08-01 MED ORDER — INSULIN GLARGINE 100 UNIT/ML ~~LOC~~ SOLN
44.0000 [IU] | Freq: Two times a day (BID) | SUBCUTANEOUS | Status: DC
  Administered 2019-08-01 (×2): 44 [IU] via SUBCUTANEOUS
  Filled 2019-08-01 (×4): qty 0.44

## 2019-08-01 MED ORDER — INSULIN ASPART 100 UNIT/ML ~~LOC~~ SOLN
10.0000 [IU] | Freq: Three times a day (TID) | SUBCUTANEOUS | Status: DC
  Administered 2019-08-01: 10 [IU] via SUBCUTANEOUS
  Filled 2019-08-01: qty 1

## 2019-08-01 MED ORDER — LOSARTAN POTASSIUM 50 MG PO TABS
50.0000 mg | ORAL_TABLET | Freq: Every day | ORAL | Status: DC
  Administered 2019-08-01 – 2019-08-02 (×2): 50 mg via ORAL
  Filled 2019-08-01 (×2): qty 1

## 2019-08-01 NOTE — Progress Notes (Signed)
Cross Cover Brief note F/U abnormal troponins - 93 and EKG, repeat troponins improved 93-85 -64. ST elevation in lateral leads not appreciated on repeat EKG and patient states the left side and shoulder radiating pain that he experienced yesterday afternoon that initiated the workup has dissipated.  Patient also had reported to the nurse and myself scrotal pain 10/10. Prior history of torsion per patient report but no surgical intervention. Bedside exam patient with right scrotum lower than left but patient unable to tolerate testicular exam.  Scrotal ultrasound: IMPRESSION: 1. No sonographic evidence for testicular torsion. 2. Slightly enlarged and heterogeneous appearance of the left epididymis can be seen in patients with epididymitis in the appropriate clinical setting. 3. Microlithiasis bilaterally. Current literature suggests that testicular microlithiasis is not a significant independent risk factor for development of testicular carcinoma, and that follow up imaging is not warranted in the absence of other risk factors. Monthly testicular self-examination and annual physical exams are considered appropriate surveillance. If patient has other risk factors for testicular carcinoma, then referral to Urology should be considered. (Reference: DeCastro, et al.: A 5-Year Follow up Study of Asymptomatic Men with Testicular Microlithiasis. J Urol 2008; 179:1420-1423.) 4. Trace left-sided hydrocele.  Nursing to obtain family history when patient mother comes in today

## 2019-08-01 NOTE — Progress Notes (Signed)
PROGRESS NOTE    Kevin Sanders  MCN:[id number] DOB: 10/01/2000 DOA: 07/29/2019 PCP: [facility]  Brief Narrative:  18 year old male admitted directly to intensive care unit on 07/29/2019 for diabetic ketoacidosis, acute kidney injury, rhabdomyolysis.  He required insulin and bicarbonate drips.  His anion gap has been closing and rhabdomyolysis resolving.  Trend in diagnostic indices has been reassuring.  Case has been discussed with Dr. Jayme Cloud of PCCM.  Patient will be ready for transfer to the hospitalist service on 07/31/2019.  5/22: Patient seen and examined. Patient's father at bedside. DKA is resolved. Patient appears to be tolerating p.o. intake. Sugars better controlled. Blood pressure remains significantly elevated. Patient himself overall asymptomatic. Does endorse some left-sided abdominal pain. Serum CK is downtrending.  5/23: Patient seen and examined.  No family was at bedside this morning.  Overnight cross cover called due to acute testicular pain.  Patient had a ultrasound done that demonstrated possible epididymitis.  Patient had no fevers or chills.  No other clinical indicators of infection.  On my interview this morning patient denies any further testicular pain.  He does have a history of torsion but no torsion was noted on the ultrasound.   Assessment & Plan:   Active Problems:   DKA (diabetic ketoacidoses) (HCC)  Diabetic ketoacidosis New diagnosis diabetes mellitus, likely type I Suspect patient is a new diagnosis of type 1 diabetes Regardless he will be insulin-dependent for now I had lengthy discussion with both patient and patient's father at bedside All questions have been answered Diabetes coordinator following as well, recommendations appreciated Glycemic control improved over interval Plan: Lantus 44 units twice daily NovoLog 10 units 3 times daily with meals Moderate sliding scale coverage Nightly coverage Carb controlled diet Diabetes  coordinator consult Patient will benefit from outpatient referral to endocrinology and PCP  Hypertensive urgency Patient with significant elevation in his blood pressure Continue amlodipine 10 mg daily Continue hydrochlorothiazide 25 mg daily Continue losartan, increase dose to 50 mg daily  Rhabdomyolysis Significant elevation in serum CK Downtrending No renal insufficiency or hematuria reported Plan: Patient is drinking plenty of fluids and no indication for IV fluids at this time.  Will discontinue.  Continue to trend CK  Acute kidney injury, resolved Hypotonic hyponatremia Metabolic indices improving No indication for bicarb infusion Maintenance infusion of normal saline  Hepatic steatosis Noted on abdominal ultrasound Follow LFTs  Testicular pain Reported overnight on 07/31/2019.  Patient has a history of testicular torsion.  Testicular ultrasound did not reveal any torsion.  Is a mention of possible epididymitis.  Patient has no history of STDs.  No other obvious infectious signs or symptoms.  Testicular pain had resolved on time of my evaluation this morning.  We will order urinalysis and a gonorrhea and chlamydia PCR.  No indication for antibiotics at this time.  DVT prophylaxis: Lovenox  code Status: Full code Family Communication: No family at bedside today.  Discussed with both mother and father on 07/31/2019 at bedside disposition Plan: Status is: Inpatient  Remains inpatient appropriate because:Inpatient level of care appropriate due to severity of illness   Dispo: The patient is from: Home              Anticipated d/c is to: Home              Anticipated d/c date is: 2 days              Patient currently is not medically stable to d/c.  DKA resolved.  Continue to titrate insulin regimen. Still with hypertensive urgency. Titrating blood pressure regimen.   Consultants:   none  Procedures:   none  Antimicrobials:   none   Subjective: Seen and  examined.  Some testicular pain noted overnight.  This morning resolved.  No complaints.  Objective: Vitals:   08/01/19 0300 08/01/19 0400 08/01/19 0500 08/01/19 0813  BP: (!) 160/92 (!) 163/92 (!) 162/97 (!) 169/97  Pulse:  (!) 114 90 (!) 114  Resp: (!) 29 (!) 22 (!) 23   Temp: 98 F (36.7 C)   98.4 F (36.9 C)  TempSrc: Oral   Oral  SpO2: 98% 93% 94% 98%  Weight:      Height:        Intake/Output Summary (Last 24 hours) at 08/01/2019 1340 Last data filed at 08/01/2019 0855 Gross per 24 hour  Intake 2011.17 ml  Output 4075 ml  Net -2063.83 ml   Filed Weights   07/29/19 1543 07/30/19 0010  Weight: 116.6 kg 113.9 kg    Examination:  General exam: Appears calm and comfortable  Respiratory system: Clear to auscultation. Respiratory effort normal. Cardiovascular system: S1 & S2 heard, RRR. No JVD, murmurs, rubs, gallops or clicks. No pedal edema. Gastrointestinal system: Abdomen is nondistended, soft and nontender. No organomegaly or masses felt. Normal bowel sounds heard. Central nervous system: Alert and oriented. No focal neurological deficits. Extremities: Symmetric 5 x 5 power. Skin: No rashes, lesions or ulcers Psychiatry: Judgement and insight appear normal. Mood & affect appropriate.     Data Reviewed: I have personally reviewed following labs and imaging studies  CBC: Recent Labs  Lab 07/29/19 1550 07/30/19 0419 07/31/19 0516  WBC 17.9* 18.9* 10.4  NEUTROABS  --   --  7.9*  HGB 18.4* 17.8* 14.0  HCT 56.9* 50.7 41.8  MCV 76.2* 71.4* 74.0*  PLT 453* 336 250   Basic Metabolic Panel: Recent Labs  Lab 07/30/19 0743 07/30/19 1243 07/30/19 1740 07/31/19 0516 08/01/19 0336  NA 139 135 135 133* 134*  K 3.8 4.1 4.1 3.5 3.3*  CL 105 101 103 98 98  CO2 22 21* 21* 22 25  GLUCOSE 223* 324* 291* 291* 241*  BUN 24* 23* 22* 23* 18  CREATININE 1.02 1.01 1.08 0.97 0.92  CALCIUM 8.7* 8.7* 9.0 8.6* 8.7*  MG  --   --   --  1.9 1.9  PHOS  --   --   --  2.2*  --     GFR: Estimated Creatinine Clearance: 159.4 mL/min (by C-G formula based on SCr of 0.92 mg/dL). Liver Function Tests: Recent Labs  Lab 07/29/19 1651 07/30/19 0419 08/01/19 0336  AST 321* 350* 261*  ALT 123* 105* 91*  ALKPHOS 94 74 54  BILITOT 2.4* 1.5* 1.3*  PROT 8.1 7.0 5.9*  ALBUMIN 4.4 3.6 3.0*   Recent Labs  Lab 07/31/19 0516  LIPASE 44   No results for input(s): AMMONIA in the last 168 hours. Coagulation Profile: No results for input(s): INR, PROTIME in the last 168 hours. Cardiac Enzymes: Recent Labs  Lab 07/29/19 1651 07/30/19 0419 07/31/19 0516 08/01/19 0336  CKTOTAL 45,235* 51,785* 38,179* 18,389*   BNP (last 3 results) No results for input(s): PROBNP in the last 8760 hours. HbA1C: Recent Labs    07/29/19 1651  HGBA1C 11.4*   CBG: Recent Labs  Lab 07/31/19 1149 07/31/19 1601 07/31/19 2230 08/01/19 0800 08/01/19 1142  GLUCAP 321* 298* 297* 215* 234*   Lipid Profile: Recent Labs  08/01/19 0336  CHOL 252*  HDL 30*  LDLCALC UNABLE TO CALCULATE IF TRIGLYCERIDE OVER 400 mg/dL  TRIG 253*  CHOLHDL 8.4  LDLDIRECT 104.9*   Thyroid Function Tests: No results for input(s): TSH, T4TOTAL, FREET4, T3FREE, THYROIDAB in the last 72 hours. Anemia Panel: No results for input(s): VITAMINB12, FOLATE, FERRITIN, TIBC, IRON, RETICCTPCT in the last 72 hours. Sepsis Labs: No results for input(s): PROCALCITON, LATICACIDVEN in the last 168 hours.  Recent Results (from the past 240 hour(s))  SARS Coronavirus 2 by RT PCR (hospital order, performed in [facility] hospital lab) Nasopharyngeal Nasopharyngeal Swab     Status: None   Collection Time: 07/29/19  5:05 PM   Specimen: Nasopharyngeal Swab  Result Value Ref Range Status   SARS Coronavirus 2 NEGATIVE NEGATIVE Final    Comment: (NOTE) SARS-CoV-2 target nucleic acids are NOT DETECTED. The SARS-CoV-2 RNA is generally detectable in upper and lower respiratory specimens during the acute phase of infection.  The lowest concentration of SARS-CoV-2 viral copies this assay can detect is 250 copies / mL. A negative result does not preclude SARS-CoV-2 infection and should not be used as the sole basis for treatment or other patient management decisions.  A negative result may occur with improper specimen collection / handling, submission of specimen other than nasopharyngeal swab, presence of viral mutation(s) within the areas targeted by this assay, and inadequate number of viral copies (<250 copies / mL). A negative result must be combined with clinical observations, patient history, and epidemiological information. Fact Sheet for Patients:   BoilerBrush.com.cy Fact Sheet for Healthcare Providers: https://pope.com/ This test is not yet approved or cleared  by the [place] FDA and has been authorized for detection and/or diagnosis of SARS-CoV-2 by FDA under an Emergency Use Authorization (EUA).  This EUA will remain in effect (meaning this test can be used) for the duration of the COVID-19 declaration under Section 564(b)(1) of the Act, 21 U.S.C. section 360bbb-3(b)(1), unless the authorization is terminated or revoked sooner. Performed at [facility], [address] [place], [place] [address]   MRSA PCR Screening     Status: None   Collection Time: 07/30/19 12:12 AM   Specimen: Nasopharyngeal  Result Value Ref Range Status   MRSA by PCR NEGATIVE NEGATIVE Final    Comment:        The GeneXpert MRSA Assay (FDA approved for NASAL specimens only), is one component of a comprehensive MRSA colonization surveillance program. It is not intended to diagnose MRSA infection nor to guide or monitor treatment for MRSA infections. Performed at [facility], [address] [place], [place] [address]          Radiology Studies: US SCROTUM W/DOPPLER  Result Date: 07/31/2019 CLINICAL DATA:  Scrotal pain and  tenderness. Testicular pain x2 days. EXAM: SCROTAL ULTRASOUND DOPPLER ULTRASOUND OF THE TESTICLES TECHNIQUE: Complete ultrasound examination of the testicles, epididymis, and other scrotal structures was performed. Color and spectral Doppler ultrasound were also utilized to evaluate blood flow to the testicles. COMPARISON:  None. FINDINGS: Right testicle Measurements: 4.2 x 2.4 x 3.1 cm. There is no mass. 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 Left testicle Measurements: 4 x 2.1 x 2.6 cm. There is no mass. 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 Right epididymis:  Normal in size and appearance. Left epididymis: The left epididymis appears slightly enlarged when compared to the right. Hydrocele: There is a trace left-sided hydrocele. There appears to be some mild scrotal wall thickening over the left testicle. Varicocele:  None visualized. Pulsed  Doppler interrogation of both testes demonstrates normal low resistance arterial and venous waveforms bilaterally. IMPRESSION: 1. No sonographic evidence for testicular torsion. 2. Slightly enlarged and heterogeneous appearance of the left epididymis can be seen in patients with epididymitis in the appropriate clinical setting. 3. Microlithiasis bilaterally. Current literature suggests that testicular microlithiasis is not a significant independent risk factor for development of testicular carcinoma, and that follow up imaging is not warranted in the absence of other risk factors. Monthly testicular self-examination and annual physical exams are considered appropriate surveillance. If patient has other risk factors for testicular carcinoma, then referral to Urology should be considered. (Reference: DeCastro, et al.: A 5-Year Follow up Study of Asymptomatic Men with Testicular Microlithiasis. J Urol 2008; 179:1420-1423.) 4. Trace left-sided hydrocele. Electronically Signed   By: Katherine Mantle M.D.   On: 07/31/2019 22:21        Scheduled Meds: . amLODipine  10 mg Oral Daily  . Chlorhexidine  Gluconate Cloth  6 each Topical Daily  . heparin  5,000 Units Subcutaneous Q8H  . hydrochlorothiazide  25 mg Oral Daily  . insulin aspart  0-15 Units Subcutaneous TID WC  . insulin aspart  0-5 Units Subcutaneous QHS  . insulin aspart  10 Units Subcutaneous TID WC  . insulin glargine  44 Units Subcutaneous BID  . lidocaine  1 patch Transdermal Q24H  . losartan  50 mg Oral Daily  . sodium chloride flush  3 mL Intravenous Once   Continuous Infusions:    LOS: 3 days    Time spent: 35 minutes    Tresa Moore, MD [facility] Pager [phone]  If 7PM-7AM, please contact night-coverage 08/01/2019, 1:40 PM

## 2019-08-02 LAB — GLUCOSE, CAPILLARY
Glucose-Capillary: 172 mg/dL — ABNORMAL HIGH (ref 70–99)
Glucose-Capillary: 173 mg/dL — ABNORMAL HIGH (ref 70–99)

## 2019-08-02 LAB — CK: Total CK: 4956 U/L — ABNORMAL HIGH (ref 49–397)

## 2019-08-02 LAB — HEMOGLOBIN A1C
Hgb A1c MFr Bld: 12.1 % — ABNORMAL HIGH (ref 4.8–5.6)
Mean Plasma Glucose: 301 mg/dL

## 2019-08-02 MED ORDER — LOSARTAN POTASSIUM 50 MG PO TABS: 50.0000 mg | ORAL_TABLET | Freq: Every day | ORAL | 0 refills | Status: DC

## 2019-08-02 MED ORDER — INSULIN GLARGINE 100 UNIT/ML ~~LOC~~ SOLN
48.0000 [IU] | Freq: Two times a day (BID) | SUBCUTANEOUS | Status: DC
  Administered 2019-08-02: 48 [IU] via SUBCUTANEOUS
  Filled 2019-08-02 (×3): qty 0.48

## 2019-08-02 MED ORDER — METFORMIN HCL 500 MG PO TABS: 500.0000 mg | ORAL_TABLET | Freq: Two times a day (BID) | ORAL | 0 refills | Status: DC

## 2019-08-02 MED ORDER — AMLODIPINE BESYLATE 10 MG PO TABS: 10.0000 mg | ORAL_TABLET | Freq: Every day | ORAL | 0 refills | Status: DC

## 2019-08-02 MED ORDER — FREESTYLE LIBRE 2 SENSOR MISC: 2 refills | Status: DC

## 2019-08-02 MED ORDER — HYDROCHLOROTHIAZIDE 25 MG PO TABS: 25.0000 mg | ORAL_TABLET | Freq: Every day | ORAL | 0 refills | Status: DC

## 2019-08-02 MED ORDER — BLOOD GLUCOSE MONITOR KIT: PACK | 0 refills | Status: DC

## 2019-08-02 MED ORDER — INSULIN ASPART 100 UNIT/ML ~~LOC~~ SOLN
12.0000 [IU] | Freq: Three times a day (TID) | SUBCUTANEOUS | Status: DC
  Administered 2019-08-02 (×2): 12 [IU] via SUBCUTANEOUS
  Filled 2019-08-02 (×2): qty 1

## 2019-08-02 MED ORDER — INSULIN PEN NEEDLE 32G X 4 MM MISC: 1.0000 [IU] | Freq: Three times a day (TID) | 1 refills | Status: DC

## 2019-08-02 MED ORDER — LANTUS SOLOSTAR 100 UNIT/ML ~~LOC~~ SOPN: 44.0000 [IU] | PEN_INJECTOR | Freq: Two times a day (BID) | SUBCUTANEOUS | 1 refills | Status: DC

## 2019-08-02 MED ORDER — INSULIN LISPRO (1 UNIT DIAL) 100 UNIT/ML (KWIKPEN): 10.0000 [IU] | PEN_INJECTOR | Freq: Three times a day (TID) | SUBCUTANEOUS | 1 refills | Status: DC

## 2019-08-02 NOTE — Progress Notes (Signed)
Nutrition Brief Note  RD received another consult for diet education. Diet education regarding new diagnosis of DM was completed on 5/21. Please see RD note from 5/21. If further education is needed consider referral for outpatient nutrition counseling with registered dietitian.  Felix Pacini, MS, RD, LDN Pager number available on Amion

## 2019-08-02 NOTE — Progress Notes (Signed)
Inpatient Diabetes Program Recommendations  AACE/ADA: New Consensus Statement on Inpatient Glycemic Control (2015)  Target Ranges:  Prepandial:   less than 140 mg/dL      Peak postprandial:   less than 180 mg/dL (1-2 hours)      Critically ill patients:  140 - 180 mg/dL   Lab Results  Component Value Date   GLUCAP 173 (H) 08/02/2019   HGBA1C 12.1 (H) 07/30/2019    Review of Glycemic Control Results for Kevin Sanders, Kevin Sanders (MRN [id number]) as of 08/02/2019 13:33  Ref. Range 08/01/2019 21:25 08/02/2019 08:26 08/02/2019 11:27  Glucose-Capillary Latest Ref Range: 70 - 99 mg/dL 265 (H) 172 (H) 173 (H)   Diabetes history: DM -New diagnosis (possible Type 1) Outpatient Diabetes medications: None Current orders for Inpatient glycemic control:  Lantus 48units bid, Novolog 12 units tid with meals, Novolog moderate tid with meals and HS  Inpatient Diabetes Program Recommendations:    Blood sugars much better today.  Met with patient and Mom today.  Patient sitting in chair and very engaged.  We discussed that c-peptide was low indicating potential for Type 1 DM.  Gave patient Type 1 booklet of Hope and discussed survival skills including complications, importance of control of blood sugars, BP and cholesterol, CHO and how they raise blood sugars and also provide glucose for body, absolute need for insulin daily, sick day rules, and hypoglycemia including treatment.  Patient was able to demonstrate use of insulin pen (we reviewed on Friday as well).  He is still wearing Freestyle Libre which was applied on Friday 5/21.  Patient seems much more agreeable to wearing sensor and seems to see the benefits of not having to stick fingers. We practiced and sensor reading 127 mg/dL when I was in his room. We also discussed hypoglycemia prevention with exercise.  Ordered patient JDRF adult starter kit which will be shipped to his house.  Also discussed the effects of ETOH on blood sugars and increased risk for  hypoglycemia. Patient and mother verbalized understanding.  He states that he used to see MD at [facility] for his BP and is interested in seeing endocrinologist from there.  Mom is going to help him today get set-up with PCP as well.    At D/C consider:  At d/c patient will need rx for: -Lantus Solostar pen [phone] -Humalog Kwikpen [phone] -Insulin pen needles #[id number] -Glucose monitoring kit #[id number] -Freestyle Libre sensors (2 per month-He already has reader)- [phone]  Also consider addition of metformin if appropriate.    Thanks  Adah Perl, RN, BC-ADM Inpatient Diabetes Coordinator Pager [phone] (8a-5p)

## 2019-08-02 NOTE — Discharge Summary (Signed)
Physician Discharge Summary  FREDICK SCHLOSSER HDQ:[id number] DOB: May 10, 2000 DOA: 07/29/2019  PCP: [facility]  Admit date: 07/29/2019 Discharge date: 08/02/2019  Admitted From: Home Disposition: Home  Recommendations for Outpatient Follow-up:  1. Follow up with PCP in 1-2 weeks 2. Referral sent to outpatient endocrinology  Home Health: No Equipment/Devices: None  Discharge Condition: Stable CODE STATUS: Full Diet recommendation: Carb modified Brief/Interim Summary: 19 year old male admitted directly to intensive care unit on 07/29/2019 for diabetic ketoacidosis, acute kidney injury, rhabdomyolysis. He required insulin and bicarbonate drips. His anion gap has been closing and rhabdomyolysis resolving. Trend in diagnostic indices has been reassuring. Case has been discussed with Dr. Patsey Berthold of PCCM. Patient will be ready for transfer to the hospitalist service on 07/31/2019.  5/22: Patient seen and examined. Patient's father at bedside. DKA is resolved. Patient appears to be tolerating p.o. intake. Sugars better controlled. Blood pressure remains significantly elevated. Patient himself overall asymptomatic. Does endorse some left-sided abdominal pain. Serum CK is downtrending.  5/23: Patient seen and examined.  No family was at bedside this morning.  Overnight cross cover called due to acute testicular pain.  Patient had a ultrasound done that demonstrated possible epididymitis.  Patient had no fevers or chills.  No other clinical indicators of infection.  On my interview this morning patient denies any further testicular pain.  He does have a history of torsion but no torsion was noted on the ultrasound.  5/24: Patient seen and examined.  Stable in no distress.  No pain.  Blood pressure and blood sugar control have improved.  Long discussion with patient.  Requested reevaluation by diabetes coordinator.  Recommendations appreciated.  Stable for discharge home at this time.   Mother will help him set up with PCP.  Referral sent to outpatient endocrinology.  Discharge Diagnoses:  Active Problems:   DKA (diabetic ketoacidoses) (HCC)  Diabetic ketoacidosis New diagnosis diabetes mellitus, likely type I Suspect patient is a new diagnosis of type 1 diabetes Regardless he will be insulin-dependent for now I had lengthy discussion with both patient and patient's father at bedside All questions have been answered Diabetes coordinator following as well, recommendations appreciated Glycemic control improved over interval Discharge plan: Lantus 44 units twice daily NovoLog 10 units 3 times daily with meals Metformin 500 mg twice daily Oral agent can possibly be discontinued once further determination of type I versus type II as outpatient Referral sent to outpatient endocrinology  Hypertensive urgency Patient with significant elevation in his blood pressure Significant improvement over interval Discharge plan: Continue amlodipine 10 mg daily Continue hydrochlorothiazide 25 mg daily Continue losartan, increase dose to 50 mg daily Outpatient PCP follow-up  Rhabdomyolysis Significant elevation in serum CK Downtrending No renal insufficiency or hematuria reported CK decreased to less than 6000 at time of discharge  Acute kidney injury, resolved Hypotonic hyponatremia Metabolic indices improving No indication for bicarb infusion Maintenance infusion of normal saline  Hepatic steatosis Noted on abdominal ultrasound Follow LFTs Outpatient PCP follow-up  Discharge Instructions  Discharge Instructions    Ambulatory referral to Internal Medicine   Complete by: As directed    New diagnosis insulin dependent diabetes   Ambulatory referral to Nutrition and Diabetic Education   Complete by: As directed    New diagnosis of DM- new to insulin Likely would benefit from 1:1   Diet - low sodium heart healthy   Complete by: As directed    Increase activity  slowly   Complete by: As directed  Allergies as of 08/02/2019      Reactions   Augmentin [amoxicillin-pot Clavulanate]       Medication List    TAKE these medications   amLODipine 10 MG tablet Commonly known as: NORVASC Take 1 tablet (10 mg total) by mouth daily. Start taking on: Aug 03, 2019   blood glucose meter kit and supplies Kit Dispense based on patient and insurance preference. Use up to four times daily as directed. (FOR ICD-9 250.00, 250.01).   FreeStyle Libre 2 Sensor Misc Dispense 2 per month.  Use as directed   hydrochlorothiazide 25 MG tablet Commonly known as: HYDRODIURIL Take 1 tablet (25 mg total) by mouth daily. Start taking on: Aug 03, 2019   insulin lispro 100 UNIT/ML KwikPen Commonly known as: HumaLOG KwikPen Inject 0.1 mLs (10 Units total) into the skin 3 (three) times daily.   Insulin Pen Needle 32G X 4 MM Misc 1 Units by Does not apply route 4 (four) times daily -  before meals and at bedtime.   Lantus SoloStar 100 UNIT/ML Solostar Pen Generic drug: insulin glargine Inject 44 Units into the skin 2 (two) times daily.   losartan 50 MG tablet Commonly known as: COZAAR Take 1 tablet (50 mg total) by mouth daily. Start taking on: Aug 03, 2019   metFORMIN 500 MG tablet Commonly known as: Glucophage Take 1 tablet (500 mg total) by mouth 2 (two) times daily with a meal.       Allergies  Allergen Reactions  . Augmentin [Amoxicillin-Pot Clavulanate]     Consultations:  ICU   Procedures/Studies: DG Chest Port 1 View  Result Date: 07/29/2019 CLINICAL DATA:  Leukocytosis with new onset diabetes and DKA. Fatigue and vomiting. EXAM: PORTABLE CHEST 1 VIEW COMPARISON:  None. FINDINGS: The cardiomediastinal contours are normal. The lungs are clear. Pulmonary vasculature is normal. No consolidation, pleural effusion, or pneumothorax. No acute osseous abnormalities are seen. IMPRESSION: Negative portable AP view of the chest. Electronically  Signed   By: Keith Rake M.D.   On: 07/29/2019 20:08   US SCROTUM W/DOPPLER  Result Date: 07/31/2019 CLINICAL DATA:  Scrotal pain and tenderness. Testicular pain x2 days. EXAM: SCROTAL ULTRASOUND DOPPLER ULTRASOUND OF THE TESTICLES TECHNIQUE: Complete ultrasound examination of the testicles, epididymis, and other scrotal structures was performed. Color and spectral Doppler ultrasound were also utilized to evaluate blood flow to the testicles. COMPARISON:  None. FINDINGS: Right testicle Measurements: 4.2 x 2.4 x 3.1 cm. There is no mass. 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 Left testicle Measurements: 4 x 2.1 x 2.6 cm. There is no mass. 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 Right epididymis:  Normal in size and appearance. Left epididymis: The left epididymis appears slightly enlarged when compared to the right. Hydrocele: There is a trace left-sided hydrocele. There appears to be some mild scrotal wall thickening over the left testicle. Varicocele:  None visualized. Pulsed Doppler interrogation of both testes demonstrates normal low resistance arterial and venous waveforms bilaterally. IMPRESSION: 1. No sonographic evidence for testicular torsion. 2. Slightly enlarged and heterogeneous appearance of the left epididymis can be seen in patients with epididymitis in the appropriate clinical setting. 3. Microlithiasis bilaterally. Current literature suggests that testicular microlithiasis is not a significant independent risk factor for development of testicular carcinoma, and that follow up imaging is not warranted in the absence of other risk factors. Monthly testicular self-examination and annual physical exams are considered appropriate surveillance. If patient has other risk factors for testicular carcinoma, then referral to Urology should be considered. (Reference: Leretha Pol, et  al.: A 5-Year Follow up Study of Asymptomatic Men with Testicular Microlithiasis. J Urol 2008; 003:[id number].) 4. Trace left-sided hydrocele.  Electronically Signed   By: Constance Holster M.D.   On: 07/31/2019 22:21   US ABDOMEN LIMITED RUQ  Result Date: 07/29/2019 CLINICAL DATA:  19 year old with elevated LFTs. EXAM: ULTRASOUND ABDOMEN LIMITED RIGHT UPPER QUADRANT COMPARISON:  Noncontrast CT 01/17/2017 FINDINGS: Gallbladder: Physiologically distended. No gallstones or wall thickening visualized. No sonographic Murphy sign noted by sonographer. Common bile duct: Diameter: 4 mm, normal. Liver: Diffusely heterogeneous and increased hepatic parenchymal echogenicity. The liver parenchyma is difficult to penetrate. No obvious focal lesion. Portal vein is patent on color Doppler imaging with normal direction of blood flow towards the liver. Other: No right upper quadrant ascites. IMPRESSION: 1. Increased hepatic echogenicity consistent with steatosis. The liver parenchyma is difficult to penetrate, no obvious focal abnormality. 2. Normal sonographic appearance of the gallbladder and biliary tree. Electronically Signed   By: Keith Rake M.D.   On: 07/29/2019 18:40    (Echo, Carotid, EGD, Colonoscopy, ERCP)    Subjective: Patient seen and examined.  No complaints, feels well.  Stable for discharge home.  Discharge Exam: Vitals:   08/02/19 0411 08/02/19 0828  BP: (!) 142/63 (!) 159/104  Pulse: 92 (!) 110  Resp: 16 18  Temp: 98.2 F (36.8 C) 98.3 F (36.8 C)  SpO2: 99% 98%   Vitals:   08/01/19 2126 08/01/19 2352 08/02/19 0411 08/02/19 0828  BP: (!) 156/106 (!) 144/99 (!) 142/63 (!) 159/104  Pulse:  (!) 115 92 (!) 110  Resp: (!) 23 19 16 18   Temp: 98.1 F (36.7 C) 98.7 F (37.1 C) 98.2 F (36.8 C) 98.3 F (36.8 C)  TempSrc: Oral Oral Oral   SpO2: 100% 97% 99% 98%  Weight:      Height:        General: Pt is alert, awake, not in acute distress Cardiovascular: RRR, S1/S2 +, no rubs, no gallops Respiratory: CTA bilaterally, no wheezing, no rhonchi Abdominal: Soft, NT, ND, bowel sounds + Extremities: no edema, no  cyanosis    The results of significant diagnostics from this hospitalization (including imaging, microbiology, ancillary and laboratory) are listed below for reference.     Microbiology: Recent Results (from the past 240 hour(s))  SARS Coronavirus 2 by RT PCR (hospital order, performed in [facility] hospital lab) Nasopharyngeal Nasopharyngeal Swab     Status: None   Collection Time: 07/29/19  5:05 PM   Specimen: Nasopharyngeal Swab  Result Value Ref Range Status   SARS Coronavirus 2 NEGATIVE NEGATIVE Final    Comment: (NOTE) SARS-CoV-2 target nucleic acids are NOT DETECTED. The SARS-CoV-2 RNA is generally detectable in upper and lower respiratory specimens during the acute phase of infection. The lowest concentration of SARS-CoV-2 viral copies this assay can detect is 250 copies / mL. A negative result does not preclude SARS-CoV-2 infection and should not be used as the sole basis for treatment or other patient management decisions.  A negative result may occur with improper specimen collection / handling, submission of specimen other than nasopharyngeal swab, presence of viral mutation(s) within the areas targeted by this assay, and inadequate number of viral copies (<250 copies / mL). A negative result must be combined with clinical observations, patient history, and epidemiological information. Fact Sheet for Patients:   StrictlyIdeas.no Fact Sheet for Healthcare Providers: BankingDealers.co.za This test is not yet approved or cleared  by the [place] FDA and has been authorized for detection  and/or diagnosis of SARS-CoV-2 by FDA under an Emergency Use Authorization (EUA).  This EUA will remain in effect (meaning this test can be used) for the duration of the COVID-19 declaration under Section 564(b)(1) of the Act, 21 U.S.C. section 360bbb-3(b)(1), unless the authorization is terminated or revoked sooner. Performed at  [facility], [address] [place], [place] [address]   MRSA PCR Screening     Status: None   Collection Time: 07/30/19 12:12 AM   Specimen: Nasopharyngeal  Result Value Ref Range Status   MRSA by PCR NEGATIVE NEGATIVE Final    Comment:        The GeneXpert MRSA Assay (FDA approved for NASAL specimens only), is one component of a comprehensive MRSA colonization surveillance program. It is not intended to diagnose MRSA infection nor to guide or monitor treatment for MRSA infections. Performed at [facility], [address] [place], [place] [address]   [place] rt PCR [facility] only)     Status: None   Collection Time: 08/01/19  2:12 PM   Specimen: Urine  Result Value Ref Range Status   Specimen source GC/Chlam URINE, RANDOM  Final   Chlamydia Tr NOT DETECTED NOT DETECTED Final   N gonorrhoeae NOT DETECTED NOT DETECTED Final    Comment: (NOTE) This CT/NG assay has not been evaluated in patients with a history of  hysterectomy. Performed at [facility], [address] [place], [place] [address]      Labs: BNP (last 3 results) No results for input(s): BNP in the last 8760 hours. Basic Metabolic Panel: Recent Labs  Lab 07/30/19 0743 07/30/19 1243 07/30/19 1740 07/31/19 0516 08/01/19 0336  NA 139 135 135 133* 134*  K 3.8 4.1 4.1 3.5 3.3*  CL 105 101 103 98 98  CO2 22 21* 21* 22 25  GLUCOSE 223* 324* 291* 291* 241*  BUN 24* 23* 22* 23* 18  CREATININE 1.02 1.01 1.08 0.97 0.92  CALCIUM 8.7* 8.7* 9.0 8.6* 8.7*  MG  --   --   --  1.9 1.9  PHOS  --   --   --  2.2*  --    Liver Function Tests: Recent Labs  Lab 07/29/19 1651 07/30/19 0419 08/01/19 0336  AST 321* 350* 261*  ALT 123* 105* 91*  ALKPHOS 94 74 54  BILITOT 2.4* 1.5* 1.3*  PROT 8.1 7.0 5.9*  ALBUMIN 4.4 3.6 3.0*   Recent Labs  Lab 07/31/19 0516  LIPASE 44   No results for input(s): AMMONIA in the last 168 hours. CBC: Recent Labs  Lab 07/29/19 1550  07/30/19 0419 07/31/19 0516  WBC 17.9* 18.9* 10.4  NEUTROABS  --   --  7.9*  HGB 18.4* 17.8* 14.0  HCT 56.9* 50.7 41.8  MCV 76.2* 71.4* 74.0*  PLT 453* 336 206   Cardiac Enzymes: Recent Labs  Lab 07/29/19 1651 07/30/19 0419 07/31/19 0516 08/01/19 0336 08/02/19 0606  CKTOTAL 45,235* 51,785* 38,179* 18,389* 4,956*   BNP: Invalid input(s): POCBNP CBG: Recent Labs  Lab 08/01/19 1142 08/01/19 1638 08/01/19 2125 08/02/19 0826 08/02/19 1127  GLUCAP 234* 232* 265* 172* 173*   D-Dimer No results for input(s): DDIMER in the last 72 hours. Hgb A1c No results for input(s): HGBA1C in the last 72 hours. Lipid Profile Recent Labs    08/01/19 0336  CHOL 252*  HDL 30*  LDLCALC UNABLE TO CALCULATE IF TRIGLYCERIDE OVER 400 mg/dL  TRIG 642*  CHOLHDL 8.4  LDLDIRECT 104.9*   Thyroid function  studies No results for input(s): TSH, T4TOTAL, T3FREE, THYROIDAB in the last 72 hours.  Invalid input(s): FREET3 Anemia work up No results for input(s): VITAMINB12, FOLATE, FERRITIN, TIBC, IRON, RETICCTPCT in the last 72 hours. Urinalysis    Component Value Date/Time   COLORURINE YELLOW (A) 08/01/2019 1412   APPEARANCEUR CLEAR (A) 08/01/2019 1412   APPEARANCEUR Clear 01/07/2017 1132   LABSPEC 1.015 08/01/2019 1412   PHURINE 5.0 08/01/2019 1412   GLUCOSEU >=500 (A) 08/01/2019 1412   HGBUR MODERATE (A) 08/01/2019 1412   BILIRUBINUR NEGATIVE 08/01/2019 1412   BILIRUBINUR Negative 01/07/2017 1132   KETONESUR 5 (A) 08/01/2019 1412   PROTEINUR 30 (A) 08/01/2019 1412   NITRITE NEGATIVE 08/01/2019 1412   LEUKOCYTESUR NEGATIVE 08/01/2019 1412   Sepsis Labs Invalid input(s): PROCALCITONIN,  WBC,  LACTICIDVEN Microbiology Recent Results (from the past 240 hour(s))  SARS Coronavirus 2 by RT PCR (hospital order, performed in [place] hospital lab) Nasopharyngeal Nasopharyngeal Swab     Status: None   Collection Time: 07/29/19  5:05 PM   Specimen: Nasopharyngeal Swab  Result Value Ref  Range Status   SARS Coronavirus 2 NEGATIVE NEGATIVE Final    Comment: (NOTE) SARS-CoV-2 target nucleic acids are NOT DETECTED. The SARS-CoV-2 RNA is generally detectable in upper and lower respiratory specimens during the acute phase of infection. The lowest concentration of SARS-CoV-2 viral copies this assay can detect is 250 copies / mL. A negative result does not preclude SARS-CoV-2 infection and should not be used as the sole basis for treatment or other patient management decisions.  A negative result may occur with improper specimen collection / handling, submission of specimen other than nasopharyngeal swab, presence of viral mutation(s) within the areas targeted by this assay, and inadequate number of viral copies (<250 copies / mL). A negative result must be combined with clinical observations, patient history, and epidemiological information. Fact Sheet for Patients:   StrictlyIdeas.no Fact Sheet for Healthcare Providers: BankingDealers.co.za This test is not yet approved or cleared  by the [place] FDA and has been authorized for detection and/or diagnosis of SARS-CoV-2 by FDA under an Emergency Use Authorization (EUA).  This EUA will remain in effect (meaning this test can be used) for the duration of the COVID-19 declaration under Section 564(b)(1) of the Act, 21 U.S.C. section 360bbb-3(b)(1), unless the authorization is terminated or revoked sooner. Performed at [facility], [address] [place], [place] [address]   MRSA PCR Screening     Status: None   Collection Time: 07/30/19 12:12 AM   Specimen: Nasopharyngeal  Result Value Ref Range Status   MRSA by PCR NEGATIVE NEGATIVE Final    Comment:        The GeneXpert MRSA Assay (FDA approved for NASAL specimens only), is one component of a comprehensive MRSA colonization surveillance program. It is not intended to diagnose MRSA infection nor to guide  or monitor treatment for MRSA infections. Performed at [facility], [address] [place], [place] [address]   [place] rt PCR [facility] only)     Status: None   Collection Time: 08/01/19  2:12 PM   Specimen: Urine  Result Value Ref Range Status   Specimen source GC/Chlam URINE, RANDOM  Final   Chlamydia Tr NOT DETECTED NOT DETECTED Final   N gonorrhoeae NOT DETECTED NOT DETECTED Final    Comment: (NOTE) This CT/NG assay has not been evaluated in patients with a history of  hysterectomy. Performed at [facility], [address] [place], [place] [address]      Time coordinating discharge: Over 30 minutes  SIGNED:   Sidney Ace, MD  [facility] 08/02/2019, 3:06 PM Pager   If 7PM-7AM, please contact night-coverage

## 2019-08-02 NOTE — Progress Notes (Addendum)
IVs removed before discharge. Went over discharge instructions and medications with patient. All questions answered, he asked about taking Metformin and stated that he didn't think he was supposed to take it. Messaged Dr. Georgeann Oppenheim and he said as long as patient is staying hydrated he should be fine and maybe his PCP may change it later. He stated that he understood. Patient going home POV with mother.

## 2019-08-02 NOTE — TOC Initial Note (Signed)
Transition of Care [facility]) - Initial/Assessment Note    Patient Details  Name: Kevin Sanders MRN: [id number] Date of Birth: Oct 11, 2000  Transition of [facility]) CM/SW Contact:    Allayne Butcher, RN Phone Number: 08/02/2019, 4:07 PM  Clinical Narrative:                 Patient admitted with diabetic ketoacidosis, patient newly diagnosed with diabetes this admission.  Patient is independent in ADL's and requires no assistive devices.  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  Patient does not currently have a PCP.  RNCM called to [facility], [facility], Jeanelle Malling at [facility], and [facility] trying to scheduled a new patient appointment.  [facility] is not accepting patient's and all the other clinics have long waiting periods before a new patient appointment.  [facility] clinic informed this RNCM that they do not do hospital follow ups for new patient's but they usually just do a 1 week hospital follow up at the [facility] in clinic.   Patient will follow up within 1 week of today with [facility].  Patient given the number for [facility] Physician Referral line.  This RNCM informed patient that [place] [facility] has one NP that is accepting new patient's and [facility] clinic also has several physicians accepting new patient's but the appointments are far out.  Patient will work on finding a PCP, and will follow up at the [facility] clinic.   Patient has no other needs at this time, [facility], patient's mother is providing transportation home.   Expected Discharge Plan: Home/Self Care Barriers to Discharge: No Barriers Identified   Patient Goals and CMS Choice        Expected Discharge Plan and Services Expected Discharge Plan: Home/Self Care   Discharge Planning Services: CM Consult   Living arrangements for the past 2 months: Single  Family Home Expected Discharge Date: 08/02/19                                    Prior Living Arrangements/Services Living arrangements for the past 2 months: Single Family Home Lives with:: Self Patient language and need for interpreter reviewed:: Yes Do you feel safe going back to the place where you live?: Yes      Need for Family Participation in Patient Care: No (Comment) Care giver support system in place?: Yes (comment)(mother)   Criminal Activity/Legal Involvement Pertinent to Current Situation/Hospitalization: No - Comment as needed  Activities of Daily Living Home Assistive Devices/Equipment: None ADL Screening (condition at time of admission) Patient's cognitive ability adequate to safely complete daily activities?: Yes Is the patient deaf or have difficulty hearing?: No Does the patient have difficulty seeing, even when wearing glasses/contacts?: No Does the patient have difficulty concentrating, remembering, or making decisions?: No Patient able to express need for assistance with ADLs?: Yes Does the patient have difficulty dressing or bathing?: No Independently performs ADLs?: Yes (appropriate for developmental age) Does the patient have difficulty walking or climbing stairs?: No Weakness of Legs: None Weakness of Arms/Hands: None  Permission Sought/Granted Permission sought to share information with : Case Manager, Other (comment) Permission granted to share information with : Yes, Verbal Permission Granted     Permission granted to share info w AGENCY: Primary Care Physicians in area  Emotional Assessment Appearance:: Appears stated age Attitude/Demeanor/Rapport: Engaged Affect (typically observed): Accepting Orientation: : Oriented to Self, Oriented to Place, Oriented to  Time, Oriented to Situation Alcohol / Substance Use: Not Applicable Psych Involvement: No (comment)  Admission diagnosis:  Dehydration [E86.0] DKA (diabetic ketoacidoses)  ([place]) [E11.10] Leukocytosis [D72.829] Weakness [R53.1] Elevated LFTs [R79.89] Elevated CK [R74.8] Diabetic ketoacidosis without coma associated with other specified diabetes mellitus ([place]) [E13.10] Patient Active Problem List   Diagnosis Date Noted  . DKA (diabetic ketoacidoses) ([place]) 07/29/2019   PCP:  [facility] Pharmacy:   [id number] [place], [address] [place] [address] Phone: [phone] Fax: [phone]     Social Determinants of Health (SDOH) Interventions    Readmission Risk Interventions No flowsheet data found.

## 2019-08-04 ENCOUNTER — Emergency Department: Payer: 59

## 2019-08-04 ENCOUNTER — Other Ambulatory Visit: Payer: Self-pay

## 2019-08-04 ENCOUNTER — Encounter: Payer: Self-pay | Admitting: Emergency Medicine

## 2019-08-04 ENCOUNTER — Emergency Department
Admission: EM | Admit: 2019-08-04 | Discharge: 2019-08-04 | Disposition: A | Discharge status: Home / Self Care | Payer: 59 | Attending: Emergency Medicine | Admitting: Emergency Medicine

## 2019-08-04 DIAGNOSIS — I10 Essential (primary) hypertension: Secondary | ICD-10-CM | POA: Diagnosis not present

## 2019-08-04 DIAGNOSIS — N50819 Testicular pain, unspecified: Secondary | ICD-10-CM | POA: Diagnosis present

## 2019-08-04 DIAGNOSIS — Z794 Long term (current) use of insulin: Secondary | ICD-10-CM | POA: Diagnosis not present

## 2019-08-04 DIAGNOSIS — N451 Epididymitis: Secondary | ICD-10-CM | POA: Diagnosis not present

## 2019-08-04 DIAGNOSIS — Z79899 Other long term (current) drug therapy: Secondary | ICD-10-CM | POA: Insufficient documentation

## 2019-08-04 LAB — URINALYSIS, COMPLETE (UACMP) WITH MICROSCOPIC
Bacteria, UA: NONE SEEN
Bilirubin Urine: NEGATIVE
Glucose, UA: >=500 mg/dL — AB
Ketones, ur: 5 mg/dL — AB
Leukocytes,Ua: NEGATIVE
Nitrite: NEGATIVE
Protein, ur: 100 mg/dL — AB
RBC / HPF: >50 RBC/hpf — ABNORMAL HIGH (ref 0–5)
Specific Gravity, Urine: 1.01 (ref 1.005–1.030)
Squamous Epithelial / HPF: NONE SEEN (ref 0–5)
pH: 6 (ref 5.0–8.0)

## 2019-08-04 LAB — CHLAMYDIA/NGC RT PCR (ARMC ONLY)
Chlamydia Tr: NOT DETECTED
N gonorrhoeae: NOT DETECTED

## 2019-08-04 MED ORDER — CIPROFLOXACIN HCL 500 MG PO TABS: 500.0000 mg | ORAL_TABLET | Freq: Two times a day (BID) | ORAL | 0 refills | Status: AC

## 2019-08-04 MED ORDER — OXYCODONE-ACETAMINOPHEN 5-325 MG PO TABS: 1.0000 | ORAL_TABLET | ORAL | 0 refills | Status: DC | PRN

## 2019-08-04 MED ORDER — OXYCODONE-ACETAMINOPHEN 5-325 MG PO TABS
1.0000 | ORAL_TABLET | Freq: Once | ORAL | Status: AC
  Administered 2019-08-04: 1 via ORAL
  Filled 2019-08-04: qty 1

## 2019-08-04 NOTE — Discharge Instructions (Signed)
Follow-up with urology.  Please take your antibiotic as prescribed.  Apply ice to the swollen tender area.  Return if worsening. Your urine did not show gonorrhea or chlamydia.

## 2019-08-04 NOTE — ED Triage Notes (Signed)
Pt here for testicular pain. Hx of same but worse than normal. feels like swollen. No fever. No urinary sx.

## 2019-08-04 NOTE — ED Provider Notes (Signed)
[facility] Emergency Department Provider Note  ____________________________________________   First MD Initiated Contact with Patient 08/04/19 1645     (approximate)  I have reviewed the triage vital signs and the nursing notes.   HISTORY  Chief Complaint Testicle Pain    HPI Kevin Sanders is a 19 y.o. male presents emergency department complaining 10 out of 10 pain to the testicle area.  States he has sex frequently.  Is not concerned about a UTI.  States that it so painful he cannot sit.  Also painful enough that he cannot walk.  He denies fever or chills.    Past Medical History:  Diagnosis Date  . Hypertension     Patient Active Problem List   Diagnosis Date Noted  . DKA (diabetic ketoacidoses) ([place]) 07/29/2019    Past Surgical History:  Procedure Laterality Date  . none      Prior to Admission medications   Medication Sig Start Date End Date Taking? Authorizing Provider  amLODipine (NORVASC) 10 MG tablet Take 1 tablet (10 mg total) by mouth daily. 08/03/19 09/02/19  Sidney Ace, MD  blood glucose meter kit and supplies KIT Dispense based on patient and insurance preference. Use up to four times daily as directed. (FOR ICD-9 250.00, 250.01). 08/02/19   Ralene Muskrat B, MD  ciprofloxacin (CIPRO) 500 MG tablet Take 1 tablet (500 mg total) by mouth 2 (two) times daily for 7 days. 08/04/19 08/11/19  Fisher, Linden Dolin, PA-C  Continuous Blood Gluc Sensor (FREESTYLE LIBRE 2 SENSOR) MISC Dispense 2 per month.  Use as directed 08/02/19   Sidney Ace, MD  hydrochlorothiazide (HYDRODIURIL) 25 MG tablet Take 1 tablet (25 mg total) by mouth daily. 08/03/19 09/02/19  Sidney Ace, MD  insulin glargine (LANTUS SOLOSTAR) 100 UNIT/ML Solostar Pen Inject 44 Units into the skin 2 (two) times daily. 08/02/19 10/01/19  Sidney Ace, MD  insulin lispro (HUMALOG KWIKPEN) 100 UNIT/ML KwikPen Inject 0.1 mLs (10 Units total) into the skin 3  (three) times daily. 08/02/19 10/01/19  Sidney Ace, MD  Insulin Pen Needle 32G X 4 MM MISC 1 Units by Does not apply route 4 (four) times daily -  before meals and at bedtime. 08/02/19   Sreenath, Trula Slade, MD  losartan (COZAAR) 50 MG tablet Take 1 tablet (50 mg total) by mouth daily. 08/03/19 09/02/19  Sidney Ace, MD  metFORMIN (GLUCOPHAGE) 500 MG tablet Take 1 tablet (500 mg total) by mouth 2 (two) times daily with a meal. 08/02/19 09/01/19  Sreenath, Trula Slade, MD  oxyCODONE-acetaminophen (PERCOCET) 5-325 MG tablet Take 1 tablet by mouth every 4 (four) hours as needed for severe pain. 08/04/19 08/03/20  Versie Starks, PA-C    Allergies Augmentin [amoxicillin-pot clavulanate]  Family History  Problem Relation Age of Onset  . Bladder Cancer Neg Hx   . Kidney cancer Neg Hx   . Prostate cancer Neg Hx     Social History Social History   Tobacco Use  . Smoking status: Never Smoker  . Smokeless tobacco: Never Used  Substance Use Topics  . Alcohol use: No  . Drug use: No    Review of Systems  Constitutional: No fever/chills Eyes: No visual changes. ENT: No sore throat. Respiratory: Denies cough Cardiovascular: Denies chest pain Gastrointestinal: Denies abdominal pain Genitourinary: Negative for dysuria.  Positive for testicle pain Musculoskeletal: Negative for back pain. Skin: Negative for rash. Psychiatric: no mood changes,     ____________________________________________  PHYSICAL EXAM:  VITAL SIGNS: ED Triage Vitals  Enc Vitals Group     BP 08/04/19 1409 (!) 153/92     Pulse Rate 08/04/19 1409 (!) 110     Resp 08/04/19 1409 18     Temp 08/04/19 1409 98.7 F (37.1 C)     Temp Source 08/04/19 1409 Oral     SpO2 08/04/19 1409 97 %     Weight 08/04/19 1359 251 lb (113.9 kg)     Height 08/04/19 1359 _0  (1.753 m)     Head Circumference --      Peak Flow --      Pain Score 08/04/19 1359 10     Pain Loc --      Pain Edu? --      Excl. in [place]? --      Constitutional: Alert and oriented. Well appearing and in no acute distress. Eyes: Conjunctivae are normal.  Head: Atraumatic. Nose: No congestion/rhinnorhea. Mouth/Throat: Mucous membranes are moist.   Neck:  supple no lymphadenopathy noted Cardiovascular: Normal rate, regular rhythm. Heart sounds are normal Respiratory: Normal respiratory effort.  No retractions, lungs c t a  GU: Testicles and scrotum are tender to palpation, patient can barely tolerate any palpating the area.  No ulcerations or drainage noted Musculoskeletal: FROM all extremities, warm and well perfused Neurologic:  Normal speech and language.  Skin:  Skin is warm, dry and intact. No rash noted. Psychiatric: Mood and affect are normal. Speech and behavior are normal.  ____________________________________________   LABS (all labs ordered are listed, but only abnormal results are displayed)  Labs Reviewed  URINALYSIS, COMPLETE (UACMP) WITH MICROSCOPIC - Abnormal; Notable for the following components:      Result Value   Color, Urine YELLOW (*)    APPearance HAZY (*)    Glucose, UA >=500 (*)    Hgb urine dipstick LARGE (*)    Ketones, ur 5 (*)    Protein, ur 100 (*)    RBC / HPF >50 (*)    All other components within normal limits  CHLAMYDIA/NGC RT PCR ([facility] ONLY)   ____________________________________________   ____________________________________________  RADIOLOGY  Ultrasound of the scrotum shows epididymitis  ____________________________________________   PROCEDURES  Procedure(s) performed: No  Procedures    ____________________________________________   INITIAL IMPRESSION / ASSESSMENT AND PLAN / ED COURSE  Pertinent labs & imaging results that were available during my care of the patient were reviewed by me and considered in my medical decision making (see chart for details).   Patient is a 19 year old male presents emergency department with concerns of testicle/scrotum  pain.  Physical exam is consistent with orchitis versus epididymitis.  UA shows large amount of hemoglobin 5 ketones and greater than 50 RBCs, GC/chlamydia is negative  Did explain the findings to the patient.  Still feel this is more related to the epididymitis.  He was given a prescription for Cipro 500 twice daily for 14 days.  He is to follow-up with urology.  Is given pain medication.  A work note.  He is to return emergency department if worsening.  States he understands will comply.  Is discharged stable condition.    Kevin Sanders was evaluated in Emergency Department on 08/04/2019 for the symptoms described in the history of present illness. He was evaluated in the context of the global COVID-19 pandemic, which necessitated consideration that the patient might be at risk for infection with the SARS-CoV-2 virus that causes COVID-19. Institutional protocols and algorithms that  pertain to the evaluation of patients at risk for COVID-19 are in a state of rapid change based on information released by regulatory bodies including the CDC and federal and state organizations. These policies and algorithms were followed during the patient's care in the ED.   As part of my medical decision making, I reviewed the following data within the [place] notes reviewed and incorporated, Labs reviewed , Old chart reviewed, Radiograph reviewed , Notes from prior ED visits and [place] Controlled Substance Database  ____________________________________________   FINAL CLINICAL IMPRESSION(S) / ED DIAGNOSES  Final diagnoses:  Testicle pain  Acute epididymitis      NEW MEDICATIONS STARTED DURING THIS VISIT:  New Prescriptions   CIPROFLOXACIN (CIPRO) 500 MG TABLET    Take 1 tablet (500 mg total) by mouth 2 (two) times daily for 7 days.   OXYCODONE-ACETAMINOPHEN (PERCOCET) 5-325 MG TABLET    Take 1 tablet by mouth every 4 (four) hours as needed for severe pain.     Note:  This  document was prepared using Dragon voice recognition software and may include unintentional dictation errors.    Versie Starks, PA-C 08/04/19 1755    Duffy Bruce, MD 08/05/19 [phone]

## 2019-08-04 NOTE — ED Notes (Signed)
Testicular pain worse since Friday, Pain 10/10.  PA to bedside for evaluation.

## 2019-08-04 NOTE — ED Triage Notes (Signed)
First nurse note- testicular pain since highschool on and off. Started again Friday but pain worse than normal.

## 2019-08-17 NOTE — Progress Notes (Addendum)
08/18/19  9:20 AM   [name] 16-Mar-2000 [id number]  Referring provider: No referring provider defined for this encounter. Chief Complaint  Patient presents with  . epididymitis    HPI: Kevin Sanders is a 19 y.o. M who presents today for the evaluation and management of testicular pain.   Personal hx of testicular pain who saw Dr. Bernardo Heater in 2018. At that time, thought to be trauma related.  Prior to ED visit he was newly diagnosed w/ diabetes and admitted for ketoacidosis.   He visited ED on 08/04/19 c/o 10/10 pain in the testicle area.  F/u US scrotum w/ doppler negative for testicular torsion or intratesticular mass lesion. Interval enlargement of the right epididymis with heterogenous echotexture and hypervascularity suggesting acute epididymitis and small bilateral hydroceles noted. UA at time of visit revealed >50 RBC, 100 protein, and 5 ketones. GC/Chlam, Chlamydia Tr, and N gonorrhoeae negative. He was prescribed Cipro.   He feels like his symptoms were related to hospital visit. He took his antibiotics twice daily for 7 days w/ resolution of pain.   He reports of ejaculating 3x a day and notices blood in his ejaculatory fluid.   The blood in his urine (terminal) as well as testicular pain and swelling have all completely resolved.  He continues to have blood in his ejaculate fluid which is concerning to him.  He denies any ongoing dysuria or any other symptoms.   He denies prior hx of catheter use. Denies fevers, dysuria or penile discharge  PMH: Past Medical History:  Diagnosis Date  . Diabetes ([place])   . Hypertension     Surgical History: Past Surgical History:  Procedure Laterality Date  . none      Home Medications:  Allergies as of 08/18/2019      Reactions   Augmentin [amoxicillin-pot Clavulanate]       Medication List       Accurate as of August 18, 2019 11:59 PM. If you have any questions, ask your nurse or doctor.        STOP taking these  medications   oxyCODONE-acetaminophen 5-325 MG tablet Commonly known as: Percocet Stopped by: Hollice Espy, MD     TAKE these medications   amLODipine 10 MG tablet Commonly known as: NORVASC Take 1 tablet (10 mg total) by mouth daily.   blood glucose meter kit and supplies Kit Dispense based on patient and insurance preference. Use up to four times daily as directed. (FOR ICD-9 250.00, 250.01).   FreeStyle Libre 2 Sensor Misc Dispense 2 per month.  Use as directed   hydrochlorothiazide 25 MG tablet Commonly known as: HYDRODIURIL Take 1 tablet (25 mg total) by mouth daily.   insulin lispro 100 UNIT/ML KwikPen Commonly known as: HumaLOG KwikPen Inject 0.1 mLs (10 Units total) into the skin 3 (three) times daily.   Insulin Pen Needle 32G X 4 MM Misc 1 Units by Does not apply route 4 (four) times daily -  before meals and at bedtime.   Lantus SoloStar 100 UNIT/ML Solostar Pen Generic drug: insulin glargine Inject 44 Units into the skin 2 (two) times daily.   losartan 50 MG tablet Commonly known as: COZAAR Take 1 tablet (50 mg total) by mouth daily.   metFORMIN 500 MG tablet Commonly known as: Glucophage Take 1 tablet (500 mg total) by mouth 2 (two) times daily with a meal.       Allergies:  Allergies  Allergen Reactions  . Augmentin [Amoxicillin-Pot Clavulanate]  Family History: Family History  Problem Relation Age of Onset  . Bladder Cancer Neg Hx   . Kidney cancer Neg Hx   . Prostate cancer Neg Hx     Social History:  reports that he has never smoked. He has never used smokeless tobacco. He reports that he does not drink alcohol and does not use drugs.   Physical Exam: BP (!) 147/106   Pulse (!) 103   Ht 5' 9"  (1.753 m)   Wt 255 lb (115.7 kg)   BMI 37.66 kg/m   Constitutional:  Alert and oriented, No acute distress. HEENT: [place] AT, moist mucus membranes.  Trachea midline, no masses. Cardiovascular: No clubbing, cyanosis, or edema. Respiratory:  Normal respiratory effort, no increased work of breathing. GU: No CVA tenderness. Bilateral descended testicles, both normal w/ slight tenderness of right epididymis and not enlarged or fluctuant. Circumcised phallus w/ orthotopic meatus.   Skin: No rashes, bruises or suspicious lesions. Neurologic: Grossly intact, no focal deficits, moving all 4 extremities. Psychiatric: Normal mood and affect.  Laboratory Data:  Lab Results  Component Value Date   CREATININE 0.92 08/01/2019    Lab Results  Component Value Date   HGBA1C 12.1 (H) 07/30/2019   Urinalysis Negative  Pertinent Imaging: CLINICAL DATA:  Testicle pain for 1 week  EXAM: SCROTAL ULTRASOUND  DOPPLER ULTRASOUND OF THE TESTICLES  TECHNIQUE: Complete ultrasound examination of the testicles, epididymis, and other scrotal structures was performed. Color and spectral Doppler ultrasound were also utilized to evaluate blood flow to the testicles.  COMPARISON:  07/31/2019  FINDINGS: Right testicle  Measurements: 4.6 x 2.2 x 3.2 cm. No mass. Scattered microlithiasis.  Left testicle  Measurements: 4.3 x 2.3 x 2.5 cm. No mass. Scattered microlithiasis  Right epididymis: Enlarged heterogeneous and hypervascular, new finding.  Left epididymis:  Normal in size and appearance.  Hydrocele:  Small bilateral hydroceles  Varicocele:  None visualized.  Pulsed Doppler interrogation of both testes demonstrates normal low resistance arterial and venous waveforms bilaterally.  IMPRESSION: 1. Negative for testicular torsion or intratesticular mass lesion. 2. Interval enlargement of the right epididymis with heterogenous echotexture and hypervascularity suggesting acute epididymitis. 3. Small bilateral hydroceles   Electronically Signed   By: Donavan Foil M.D.   On: 08/04/2019 15:26  I have personally reviewed the images and agree with radiologist interpretation.   Assessment & Plan:    1. Right  Epididymitis  Scrotal US revealed right epididymitis  Resolved with abx Exam today is benign Cultures negative  2. Hematospermia  I explained to the patient some of the conditions that may cause hematospermia, such as: disorders of the prostate gland, seminal vesicles, spermatic cord, and ejaculatory duct system; urogenital infections including sexually transmitted infections (eg, chlamydia, herpes simplex virus, gonorrhea, trichomonas); metastatic cancers; vascular malformations; congenital and drug-induced bleeding disorders; and even frequent daily ejaculation over a period of several weeks.  I reassured him that his exam was normal and it was likely related to possible prostatitis/epididymitis as described above.  This will likely be self-limited and ultimately should resolve spontaneously.  3. Gross hematuria   Based on the nature (terminal drip) suspect this is prostate related.  He may have had a significant prostatitis leading to the right epididymitis. UA negative today, reassuring  Resolved along w/ right epididymitis   F/u prn  [facility] [address] [place], [place] [address] [phone]  I, Lucas Mallow, am acting as a scribe for Dr. Hollice Espy,  I  have reviewed the above documentation for accuracy and completeness, and I agree with the above.   Hollice Espy, MD  I spent 30 total minutes on the day of the encounter including pre-visit review of the medical record, face-to-face time with the patient, and post visit ordering of labs/imaging/tests.

## 2019-08-18 ENCOUNTER — Encounter: Payer: Self-pay | Admitting: Urology

## 2019-08-18 ENCOUNTER — Other Ambulatory Visit: Payer: Self-pay

## 2019-08-18 ENCOUNTER — Ambulatory Visit (INDEPENDENT_AMBULATORY_CARE_PROVIDER_SITE_OTHER): Payer: 59 | Admitting: Urology

## 2019-08-18 VITALS — BP 147/106 | HR 103 | Ht 69.0 in | Wt 255.0 lb

## 2019-08-18 DIAGNOSIS — R310 Gross hematuria: Secondary | ICD-10-CM | POA: Diagnosis not present

## 2019-08-18 DIAGNOSIS — R361 Hematospermia: Secondary | ICD-10-CM

## 2019-08-18 DIAGNOSIS — N451 Epididymitis: Secondary | ICD-10-CM | POA: Diagnosis not present

## 2019-08-19 LAB — URINALYSIS, COMPLETE
Bilirubin, UA: NEGATIVE
Glucose, UA: NEGATIVE
Ketones, UA: NEGATIVE
Leukocytes,UA: NEGATIVE
Nitrite, UA: NEGATIVE
RBC, UA: NEGATIVE
Specific Gravity, UA: 1.02 (ref 1.005–1.030)
Urobilinogen, Ur: 0.2 mg/dL (ref 0.2–1.0)
pH, UA: 5.5 (ref 5.0–7.5)

## 2019-08-19 LAB — MICROSCOPIC EXAMINATION
Bacteria, UA: NONE SEEN
Epithelial Cells (non renal): NONE SEEN /hpf (ref 0–10)
RBC, Urine: NONE SEEN /hpf (ref 0–2)
WBC, UA: NONE SEEN /hpf (ref 0–5)

## 2019-10-08 ENCOUNTER — Ambulatory Visit: Payer: 59 | Admitting: Internal Medicine

## 2019-10-08 NOTE — Progress Notes (Deleted)
Name: Kevin Sanders  MRN/ DOB: [id number], 2000/06/20   Age/ Sex: 19 y.o., male    PCP: Patient, No Pcp Per   Reason for Endocrinology Evaluation: Type {NUMBERS 1 OR 2:522190} Diabetes Mellitus     Date of Initial Endocrinology Visit: 10/08/2019     PATIENT IDENTIFIER: Kevin Sanders is a 19 y.o. male with a past medical history of HTN. The patient presented for initial endocrinology clinic visit on 10/08/2019 for consultative assistance with his diabetes management.    HPI: Kevin Sanders was    Diagnosed with DM 07/2019 after presenting to his PCP with symptoms of hyperglycemia. He was admitted for DKA and discharged on MDI regimen  And metformin  Currently checking blood sugars *** x / day,  before breakfast and ***.  Hypoglycemia episodes : ***               Symptoms: ***                 Frequency: ***/  Hemoglobin A1c has ranged from *** in ***, peaking at *** in ***. Patient required assistance for hypoglycemia:  Patient has required hospitalization within the last 1 year from hyper or hypoglycemia: Yes 07/2019  In terms of diet, the patient ***   HOME DIABETES REGIMEN: Lantus 44 units BID  Novolog 10 units TIDQAC Metformin 500 mg BID  Statin: no ACE-I/ARB: yes Prior Diabetic Education: {Yes/No:11203}   METER DOWNLOAD SUMMARY: Date range evaluated: *** Fingerstick Blood Glucose Tests = *** Average Number Tests/Day = *** Overall Mean FS Glucose = *** Standard Deviation = ***  BG Ranges: Low = *** High = ***   Hypoglycemic Events/30 Days: BG < 50 = *** Episodes of symptomatic severe hypoglycemia = ***   DIABETIC COMPLICATIONS: Microvascular complications:   ***  Denies: CKD  Last eye exam: Completed   Macrovascular complications:    Denies: CAD, PVD, CVA   PAST HISTORY: Past Medical History:  Past Medical History:  Diagnosis Date   Diabetes ([place])    Hypertension     Past Surgical History:  Past Surgical History:  Procedure Laterality  Date   none        Social History:  reports that he has never smoked. He has never used smokeless tobacco. He reports that he does not drink alcohol and does not use drugs. Family History:  Family History  Problem Relation Age of Onset   Bladder Cancer Neg Hx    Kidney cancer Neg Hx    Prostate cancer Neg Hx       HOME MEDICATIONS: Allergies as of 10/08/2019      Reactions   Augmentin [amoxicillin-pot Clavulanate]       Medication List       Accurate as of October 08, 2019  8:00 AM. If you have any questions, ask your nurse or doctor.        amLODipine 10 MG tablet Commonly known as: NORVASC Take 1 tablet (10 mg total) by mouth daily.   blood glucose meter kit and supplies Kit Dispense based on patient and insurance preference. Use up to four times daily as directed. (FOR ICD-9 250.00, 250.01).   FreeStyle Libre 2 Sensor Misc Dispense 2 per month.  Use as directed   hydrochlorothiazide 25 MG tablet Commonly known as: HYDRODIURIL Take 1 tablet (25 mg total) by mouth daily.   insulin lispro 100 UNIT/ML KwikPen Commonly known as: HumaLOG KwikPen Inject 0.1 mLs (10 Units total) into the skin 3 (  three) times daily.   Insulin Pen Needle 32G X 4 MM Misc 1 Units by Does not apply route 4 (four) times daily -  before meals and at bedtime.   Lantus SoloStar 100 UNIT/ML Solostar Pen Generic drug: insulin glargine Inject 44 Units into the skin 2 (two) times daily.   losartan 50 MG tablet Commonly known as: COZAAR Take 1 tablet (50 mg total) by mouth daily.   metFORMIN 500 MG tablet Commonly known as: Glucophage Take 1 tablet (500 mg total) by mouth 2 (two) times daily with a meal.        ALLERGIES: Allergies  Allergen Reactions   Augmentin [Amoxicillin-Pot Clavulanate]      REVIEW OF SYSTEMS: A comprehensive ROS was conducted with the patient and is negative except as per HPI and below:  ROS    OBJECTIVE:   VITAL SIGNS: There were no vitals taken  for this visit.   PHYSICAL EXAM:  General: Pt appears well and is in NAD  Neck: General: Supple without adenopathy or carotid bruits. Thyroid: Thyroid size normal.  No goiter or nodules appreciated. No thyroid bruit.  Lungs: Clear with good BS bilat with no rales, rhonchi, or wheezes  Heart: RRR with normal S1 and S2 and no gallops; no murmurs; no rub  Abdomen: Normoactive bowel sounds, soft, nontender, without masses or organomegaly palpable  Extremities:  Lower extremities - No pretibial edema. No lesions.  Skin: Normal texture and temperature to palpation. No rash noted. No Acanthosis nigricans/skin tags. No lipohypertrophy.  Neuro: MS is good with appropriate affect, pt is alert and Ox3    DM foot exam:    DATA REVIEWED:  Lab Results  Component Value Date   HGBA1C 12.1 (H) 07/30/2019   HGBA1C 11.4 (H) 07/29/2019   Lab Results  Component Value Date   LDLCALC UNABLE TO CALCULATE IF TRIGLYCERIDE OVER 400 mg/dL 08/01/2019   CREATININE 0.92 08/01/2019   No results found for: [facility]  Lab Results  Component Value Date   CHOL 252 (H) 08/01/2019   HDL 30 (L) 08/01/2019   LDLCALC UNABLE TO CALCULATE IF TRIGLYCERIDE OVER 400 mg/dL 08/01/2019   LDLDIRECT 104.9 (H) 08/01/2019   TRIG 642 (H) 08/01/2019   CHOLHDL 8.4 08/01/2019        ASSESSMENT / PLAN / RECOMMENDATIONS:   1) Ketosis- Prone  Diabetes Mellitus, Newly Diagnosed , With*** complications - Most recent A1c of *** %. Goal A1c < *** %.    Plan: GENERAL:  ***  MEDICATIONS:  ***  EDUCATION / INSTRUCTIONS:  BG monitoring instructions: Patient is instructed to check his blood sugars *** times a day, ***.  Call [facility] if: BG persistently < 70 or > 300.  I reviewed the Rule of 15 for the treatment of hypoglycemia in detail with the patient. Literature supplied.   2) Diabetic complications:   Eye: Does *** have known diabetic retinopathy.   Neuro/ Feet: Does *** have known  diabetic peripheral neuropathy.  Renal: Patient does *** have known baseline CKD. He is *** on an ACEI/ARB at present.Check urine albumin/creatinine ratio yearly starting at time of diagnosis. If albuminuria is positive, treatment is geared toward better glucose, blood pressure control and use of ACE inhibitors or ARBs. Monitor electrolytes and creatinine once to twice yearly.   3) Lipids: Patient is *** on a statin.    4) Hypertension: ***  at goal of < 140/90 mmHg.       Signed electronically by: Mack Guise,  MD  [facility] End[facility]  [facility] [address] [place], [place] [address] Phone: [phone] FAX: [phone]   CC: Patient, No Pcp Per No address on file Phone: None  Fax: None    Return to Endocrinology clinic as below: Future Appointments  Date Time Provider JAARS  10/08/2019  9:30 AM Dandrae Kustra, Melanie Crazier, MD [id number] None

## 2019-10-25 ENCOUNTER — Ambulatory Visit (INDEPENDENT_AMBULATORY_CARE_PROVIDER_SITE_OTHER): Payer: 59 | Admitting: Internal Medicine

## 2019-10-25 ENCOUNTER — Encounter: Payer: Self-pay | Admitting: Internal Medicine

## 2019-10-25 ENCOUNTER — Other Ambulatory Visit: Payer: Self-pay

## 2019-10-25 VITALS — BP 153/108 | HR 93 | Ht 69.0 in | Wt 283.2 lb

## 2019-10-25 DIAGNOSIS — R809 Proteinuria, unspecified: Secondary | ICD-10-CM

## 2019-10-25 DIAGNOSIS — I10 Essential (primary) hypertension: Secondary | ICD-10-CM | POA: Diagnosis not present

## 2019-10-25 DIAGNOSIS — E1129 Type 2 diabetes mellitus with other diabetic kidney complication: Secondary | ICD-10-CM | POA: Diagnosis not present

## 2019-10-25 DIAGNOSIS — E109 Type 1 diabetes mellitus without complications: Secondary | ICD-10-CM

## 2019-10-25 DIAGNOSIS — E781 Pure hyperglyceridemia: Secondary | ICD-10-CM | POA: Diagnosis not present

## 2019-10-25 DIAGNOSIS — E1010 Type 1 diabetes mellitus with ketoacidosis without coma: Secondary | ICD-10-CM

## 2019-10-25 LAB — MICROALBUMIN / CREATININE URINE RATIO
Creatinine,U: 91.2 mg/dL
Microalb Creat Ratio: 56.8 mg/g — ABNORMAL HIGH (ref 0.0–30.0)
Microalb, Ur: 51.9 mg/dL — ABNORMAL HIGH (ref 0.0–1.9)

## 2019-10-25 LAB — LIPID PANEL
Cholesterol: 198 mg/dL (ref 0–200)
HDL: 34.5 mg/dL — ABNORMAL LOW (ref >39.00)
LDL Cholesterol: 135 mg/dL — ABNORMAL HIGH (ref 0–99)
NonHDL: 163.63
Total CHOL/HDL Ratio: 6
Triglycerides: 141 mg/dL (ref 0.0–149.0)
VLDL: 28.2 mg/dL (ref 0.0–40.0)

## 2019-10-25 LAB — POCT GLUCOSE (DEVICE FOR HOME USE): Glucose Fasting, POC: 79 mg/dL (ref 70–99)

## 2019-10-25 LAB — BASIC METABOLIC PANEL
BUN: 14 mg/dL (ref 6–23)
CO2: 27 mEq/L (ref 19–32)
Calcium: 10 mg/dL (ref 8.4–10.5)
Chloride: 104 mEq/L (ref 96–112)
Creatinine, Ser: 1.26 mg/dL (ref 0.40–1.50)
GFR: 87.71 mL/min (ref >60.00)
Glucose, Bld: 90 mg/dL (ref 70–99)
Potassium: 4 mEq/L (ref 3.5–5.1)
Sodium: 140 mEq/L (ref 135–145)

## 2019-10-25 LAB — POCT GLYCOSYLATED HEMOGLOBIN (HGB A1C): Hemoglobin A1C: 6.1 % — AB (ref 4.0–5.6)

## 2019-10-25 LAB — TSH: TSH: 1.01 u[IU]/mL (ref 0.35–5.50)

## 2019-10-25 MED ORDER — LOSARTAN POTASSIUM 50 MG PO TABS: 50.0000 mg | ORAL_TABLET | Freq: Every day | ORAL | 0 refills | Status: DC

## 2019-10-25 MED ORDER — LANTUS SOLOSTAR 100 UNIT/ML ~~LOC~~ SOPN: 70.0000 [IU] | PEN_INJECTOR | Freq: Every day | SUBCUTANEOUS | 6 refills | Status: DC

## 2019-10-25 MED ORDER — METFORMIN HCL ER 500 MG PO TB24: 1000.0000 mg | ORAL_TABLET | Freq: Two times a day (BID) | ORAL | 1 refills | Status: DC

## 2019-10-25 MED ORDER — AMLODIPINE BESYLATE 10 MG PO TABS: 10.0000 mg | ORAL_TABLET | Freq: Every day | ORAL | 0 refills | Status: DC

## 2019-10-25 NOTE — Patient Instructions (Addendum)
-   Decrease Lantus to 70 units ONCE daily  - STOP Humalog  - Start Metformin 1 tablet daily with Breakfast for 1 week, then increase to 1 tablet with Breakfast and 1 tablet with Supper for  1 week, then increase to 2 tablets with Breakfast  and 1 tablet with Supper for another 1 week , then finally 2 Tablets with Breakfast and 2 tablets with Supper.      HOW TO TREAT LOW BLOOD SUGARS (Blood sugar LESS THAN 70 MG/DL)  Please follow the RULE OF 15 for the treatment of hypoglycemia treatment (when your (blood sugars are less than 70 mg/dL)    STEP 1: Take 15 grams of carbohydrates when your blood sugar is low, which includes:   3-4 GLUCOSE TABS  OR  3-4 OZ OF JUICE OR REGULAR SODA OR  ONE TUBE OF GLUCOSE GEL     STEP 2: RECHECK blood sugar in 15 MINUTES STEP 3: If your blood sugar is still low at the 15 minute recheck --> then, go back to STEP 1 and treat AGAIN with another 15 grams of carbohydrates.

## 2019-10-25 NOTE — Progress Notes (Signed)
Name: Kevin Sanders  MRN/ DOB: [id number], 2000/10/29   Age/ Sex: 19 y.o., male    PCP: Patient, No Pcp Per   Reason for Endocrinology Evaluation: Type 2 Diabetes Mellitus     Date of Initial Endocrinology Visit: 10/25/2019     PATIENT IDENTIFIER: Mr. Kevin Sanders is a 19 y.o. male with a past medical history of HTN. The patient presented for initial endocrinology clinic visit on 10/25/2019 for consultative assistance with his diabetes management.    HPI: Mr. Kevin Sanders was    Diagnosed with DM 07/2019 after presenting to his PCP with symptoms of hyperglycemia. He was admitted for DKA and discharged on MDI regimen  and metformin  Currently checking blood sugars  None. Has CGM but stopped checking due to BG's ~ 150 mg/dL.  Hypoglycemia episodes : no               Hemoglobin A1c has ranged from 6.1%  in 10/2019, peaking at 12.1% in 07/2019. Patient required assistance for hypoglycemia: no Patient has required hospitalization within the last 1 year from hyper or hypoglycemia: Yes 07/2019  In terms of diet, the patient eats 3 meals a day, stopped all sugar-sweetened beverages    Grandmother with T2DM and lives with her.    HOME DIABETES REGIMEN: Lantus 44 units BID  Novolog 10 units TIDQAC Metformin 500 mg BID- never took it   Statin: no ACE-I/ARB: yes Prior Diabetic Education: yes    METER DOWNLOAD SUMMARY:Does not check    DIABETIC COMPLICATIONS: Microvascular complications:    Denies: CKD, neuropathy   Last eye exam: Completed  none  Macrovascular complications:    Denies: CAD, PVD, CVA   PAST HISTORY: Past Medical History:  Past Medical History:  Diagnosis Date   Diabetes ([place])    Hypertension    Past Surgical History:  Past Surgical History:  Procedure Laterality Date   none        Social History:  reports that he has never smoked. He has never used smokeless tobacco. He reports that he does not drink alcohol and does not use drugs. Family  History:  Family History  Problem Relation Age of Onset   Bladder Cancer Neg Hx    Kidney cancer Neg Hx    Prostate cancer Neg Hx      HOME MEDICATIONS: Allergies as of 10/25/2019      Reactions   Augmentin [amoxicillin-pot Clavulanate]       Medication List       Accurate as of October 25, 2019  8:21 AM. If you have any questions, ask your nurse or doctor.        STOP taking these medications   amLODipine 10 MG tablet Commonly known as: NORVASC Stopped by: Dorita Sciara, MD   FreeStyle Libre 2 Sensor Misc Stopped by: Dorita Sciara, MD   hydrochlorothiazide 25 MG tablet Commonly known as: HYDRODIURIL Stopped by: Dorita Sciara, MD   losartan 50 MG tablet Commonly known as: COZAAR Stopped by: Dorita Sciara, MD   metFORMIN 500 MG tablet Commonly known as: Glucophage Stopped by: Dorita Sciara, MD     TAKE these medications   blood glucose meter kit and supplies Kit Dispense based on patient and insurance preference. Use up to four times daily as directed. (FOR ICD-9 250.00, 250.01).   HumaLOG KwikPen 100 UNIT/ML KwikPen Generic drug: insulin lispro Inject 10 Units into the skin 3 (three) times daily. What changed: Another medication with the same  name was removed. Continue taking this medication, and follow the directions you see here. Changed by: Dorita Sciara, MD   Insulin Pen Needle 32G X 4 MM Misc 1 Units by Does not apply route 4 (four) times daily -  before meals and at bedtime.   Lantus SoloStar 100 UNIT/ML Solostar Pen Generic drug: insulin glargine Inject 44 Units into the skin 2 (two) times daily. What changed: Another medication with the same name was removed. Continue taking this medication, and follow the directions you see here. Changed by: Dorita Sciara, MD        ALLERGIES: Allergies  Allergen Reactions   Augmentin [Amoxicillin-Pot Clavulanate]      REVIEW OF SYSTEMS: A  comprehensive ROS was conducted with the patient and is negative except as per HPI and below:  Review of Systems  Gastrointestinal: Negative for diarrhea and nausea.  Genitourinary: Negative for frequency.  Neurological: Negative for tingling and tremors.  Endo/Heme/Allergies: Negative for polydipsia.      OBJECTIVE:   VITAL SIGNS: Ht 5' 9"  (1.753 m)    BMI 37.66 kg/m    PHYSICAL EXAM:  General: Pt appears well and is in NAD  Neck: General: Supple without adenopathy or carotid bruits. Thyroid: Thyroid size normal.  No goiter or nodules appreciated. No thyroid bruit.  Lungs: Clear with good BS bilat with no rales, rhonchi, or wheezes  Heart: RRR with normal S1 and S2 and no gallops; no murmurs; no rub  Abdomen: Normoactive bowel sounds, soft, nontender, without masses or organomegaly palpable  Extremities:  Lower extremities - No pretibial edema. No lesions.  Skin:  + Acanthosis nigricans around the neck . No lipohypertrophy.  Neuro: MS is good with appropriate affect, pt is alert and Ox3    DM foot exam: 10/25/2019  The skin of the feet is intact without sores or ulcerations. The pedal pulses are 1+ on right and 1+ on left. The sensation is intact to a screening 5.07, 10 gram monofilament bilaterally   DATA REVIEWED:  Lab Results  Component Value Date   HGBA1C 12.1 (H) 07/30/2019   HGBA1C 11.4 (H) 07/29/2019       Results for AVYAAN, SUMMER (MRN [id number]) as of 10/27/2019 12:12  Ref. Range 10/25/2019 08:51  Sodium Latest Ref Range: 135 - 145 mEq/L 140  Potassium Latest Ref Range: 3.5 - 5.1 mEq/L 4.0  Chloride Latest Ref Range: 96 - 112 mEq/L 104  CO2 Latest Ref Range: 19 - 32 mEq/L 27  Glucose Latest Ref Range: 70 - 99 mg/dL 90  BUN Latest Ref Range: 6 - 23 mg/dL 14  Creatinine Latest Ref Range: 0.40 - 1.50 mg/dL 1.26  Calcium Latest Ref Range: 8.4 - 10.5 mg/dL 10.0  GFR Latest Ref Range: >60.00 mL/min 87.71  Total CHOL/HDL Ratio Unknown 6  Cholesterol Latest Ref  Range: 0 - 200 mg/dL 198  HDL Cholesterol Latest Ref Range: >39.00 mg/dL 34.50 (L)  LDL (calc) Latest Ref Range: 0 - 99 mg/dL 135 (H)  MICROALB/CREAT RATIO Latest Ref Range: 0.0 - 30.0 mg/g 56.8 (H)  NonHDL Unknown 163.63  Triglycerides Latest Ref Range: 0 - 149 mg/dL 141.0  VLDL Latest Ref Range: 0.0 - 40.0 mg/dL 28.2  C-Peptide Latest Ref Range: 0.80 - 3.85 ng/mL 1.02  TSH Latest Ref Range: 0.35 - 5.50 uIU/mL 1.01  Creatinine,U Latest Units: mg/dL 91.2  Microalb, Ur Latest Ref Range: 0.0 - 1.9 mg/dL 51.9 (H)    ASSESSMENT / PLAN / RECOMMENDATIONS:   1)  Ketosis- Prone  Diabetes Mellitus, With microalbuminuria  complications - Most recent A1c of 6.1 %. Goal A1c < 7.0 %.    Plan: GENERAL: I have discussed with the patient the pathophysiology of diabetes. We went over the natural progression of the disease. We talked about both insulin resistance and insulin deficiency. We stressed the importance of lifestyle changes including diet and exercise. I explained the complications associated with diabetes including retinopathy, nephropathy, neuropathy as well as increased risk of cardiovascular disease. We went over the benefit seen with glycemic control.    I explained to the patient that diabetic patients are at higher than normal risk for amputations.  Most likely ketosis prone diabetes rather than T1DM.   C- Peptide is improving.   Awaiting on  autoantobody level   He was encouraged to check glucose at home   We discussed benefits of metformin, as well as improving insulin sensitivity. We cautioned against GI side effects      MEDICATIONS: - Decrease Lantus to 70 units ONCE daily  - STOP Humalog  - Start Metformin 500 mg XR, 2 tabs BID- titration provided   EDUCATION / INSTRUCTIONS:  BG monitoring instructions: Patient is instructed to check his blood sugars 1 times a day.  Call [facility] if: BG persistently < 70 or > 300.  I reviewed the Rule of 15 for  the treatment of hypoglycemia in detail with the patient. Literature supplied.   2) Diabetic complications:   Eye: Does not have known diabetic retinopathy. He was urged to have an eye exam   Neuro/ Feet: Does not have known diabetic peripheral neuropathy.  Renal: Patient does not have known baseline CKD. He is  on an ACEI/ARB at present. His urine albumin/creatinine ratio is elevated .   3) Hypertriglyceridemia :  - We discussed increased risk of pancreatitis with elevated Tg. Repeat Tg levels have normalized. LDL slightly elevated will recommend lifestyle changes    4) Hypertension :  - Blood pressure out of control. Pt ran out of his meds ~ 3 weeks ago.  - Will refill amlodipine and losartan - Pt encouraged to establish with PCP ASAP   F/U in 3 months   Signed electronically by: Mack Guise, MD  [facility]  [facility] [address] [place], [place] [address] Phone: [phone] FAX: [phone]   CC: Patient, No Pcp Per No address on file Phone: None  Fax: None    Return to Endocrinology clinic as below: No future appointments.

## 2019-10-27 ENCOUNTER — Encounter: Payer: Self-pay | Admitting: Internal Medicine

## 2019-10-27 DIAGNOSIS — E1129 Type 2 diabetes mellitus with other diabetic kidney complication: Secondary | ICD-10-CM | POA: Insufficient documentation

## 2019-11-03 LAB — C-PEPTIDE: C-Peptide: 1.02 ng/mL (ref 0.80–3.85)

## 2019-11-03 LAB — GLUTAMIC ACID DECARBOXYLASE AUTO ABS: Glutamic Acid Decarb Ab: <5 IU/mL (ref <5)

## 2019-11-03 LAB — ISLET CELL AB SCREEN RFLX TO TITER: ISLET CELL ANTIBODY SCREEN: NEGATIVE

## 2019-11-05 ENCOUNTER — Emergency Department (HOSPITAL_COMMUNITY): Payer: 59

## 2019-11-05 ENCOUNTER — Other Ambulatory Visit: Payer: Self-pay

## 2019-11-05 ENCOUNTER — Encounter (HOSPITAL_COMMUNITY): Payer: Self-pay

## 2019-11-05 ENCOUNTER — Ambulatory Visit
Admission: EM | Admit: 2019-11-05 | Discharge: 2019-11-05 | Disposition: A | Discharge status: Home / Self Care | Payer: 59 | Attending: Emergency Medicine | Admitting: Emergency Medicine

## 2019-11-05 ENCOUNTER — Emergency Department (HOSPITAL_COMMUNITY)
Admission: EM | Admit: 2019-11-05 | Discharge: 2019-11-05 | Disposition: A | Discharge status: Home / Self Care | Payer: 59 | Source: Home / Self Care | Attending: Emergency Medicine | Admitting: Emergency Medicine

## 2019-11-05 ENCOUNTER — Encounter: Payer: Self-pay | Admitting: Emergency Medicine

## 2019-11-05 DIAGNOSIS — R197 Diarrhea, unspecified: Secondary | ICD-10-CM | POA: Insufficient documentation

## 2019-11-05 DIAGNOSIS — U071 COVID-19: Secondary | ICD-10-CM | POA: Insufficient documentation

## 2019-11-05 DIAGNOSIS — I10 Essential (primary) hypertension: Secondary | ICD-10-CM | POA: Insufficient documentation

## 2019-11-05 DIAGNOSIS — E1121 Type 2 diabetes mellitus with diabetic nephropathy: Secondary | ICD-10-CM | POA: Insufficient documentation

## 2019-11-05 DIAGNOSIS — E669 Obesity, unspecified: Secondary | ICD-10-CM | POA: Insufficient documentation

## 2019-11-05 DIAGNOSIS — Z6841 Body Mass Index (BMI) 40.0 and over, adult: Secondary | ICD-10-CM | POA: Insufficient documentation

## 2019-11-05 DIAGNOSIS — E1110 Type 2 diabetes mellitus with ketoacidosis without coma: Secondary | ICD-10-CM | POA: Insufficient documentation

## 2019-11-05 DIAGNOSIS — Z794 Long term (current) use of insulin: Secondary | ICD-10-CM | POA: Insufficient documentation

## 2019-11-05 LAB — BASIC METABOLIC PANEL
Anion gap: 11 (ref 5–15)
BUN: 12 mg/dL (ref 6–20)
CO2: 23 mmol/L (ref 22–32)
Calcium: 8.3 mg/dL — ABNORMAL LOW (ref 8.9–10.3)
Chloride: 103 mmol/L (ref 98–111)
Creatinine, Ser: 1.28 mg/dL — ABNORMAL HIGH (ref 0.61–1.24)
GFR calc Af Amer: >60 mL/min (ref >60)
GFR calc non Af Amer: >60 mL/min (ref >60)
Glucose, Bld: 87 mg/dL (ref 70–99)
Potassium: 3.4 mmol/L — ABNORMAL LOW (ref 3.5–5.1)
Sodium: 137 mmol/L (ref 135–145)

## 2019-11-05 LAB — CBC WITH DIFFERENTIAL/PLATELET
Abs Immature Granulocytes: 0.01 10*3/uL (ref 0.00–0.07)
Basophils Absolute: 0 10*3/uL (ref 0.0–0.1)
Basophils Relative: 0 %
Eosinophils Absolute: 0 10*3/uL (ref 0.0–0.5)
Eosinophils Relative: 0 %
HCT: 47.1 % (ref 39.0–52.0)
Hemoglobin: 15.5 g/dL (ref 13.0–17.0)
Immature Granulocytes: 0 %
Lymphocytes Relative: 16 %
Lymphs Abs: 0.8 10*3/uL (ref 0.7–4.0)
MCH: 24.7 pg — ABNORMAL LOW (ref 26.0–34.0)
MCHC: 32.9 g/dL (ref 30.0–36.0)
MCV: 75.1 fL — ABNORMAL LOW (ref 80.0–100.0)
Monocytes Absolute: 0.3 10*3/uL (ref 0.1–1.0)
Monocytes Relative: 6 %
Neutro Abs: 3.7 10*3/uL (ref 1.7–7.7)
Neutrophils Relative %: 78 %
Platelets: 167 10*3/uL (ref 150–400)
RBC: 6.27 MIL/uL — ABNORMAL HIGH (ref 4.22–5.81)
RDW: 13.8 % (ref 11.5–15.5)
WBC: 4.9 10*3/uL (ref 4.0–10.5)
nRBC: 0 % (ref 0.0–0.2)

## 2019-11-05 LAB — CBG MONITORING, ED: Glucose-Capillary: 93 mg/dL (ref 70–99)

## 2019-11-05 LAB — POCT FASTING CBG KUC MANUAL ENTRY: POCT Glucose (KUC): 99 mg/dL (ref 70–99)

## 2019-11-05 LAB — SARS CORONAVIRUS 2 BY RT PCR (HOSPITAL ORDER, PERFORMED IN ~~LOC~~ HOSPITAL LAB): SARS Coronavirus 2: POSITIVE — AB

## 2019-11-05 MED ORDER — SODIUM CHLORIDE 0.9 % IV BOLUS
1000.0000 mL | Freq: Once | INTRAVENOUS | Status: AC
  Administered 2019-11-05: 1000 mL via INTRAVENOUS

## 2019-11-05 MED ORDER — ALBUTEROL SULFATE HFA 108 (90 BASE) MCG/ACT IN AERS
2.0000 | INHALATION_SPRAY | Freq: Once | RESPIRATORY_TRACT | Status: AC
  Administered 2019-11-05: 2 via RESPIRATORY_TRACT
  Filled 2019-11-05: qty 6.7

## 2019-11-05 MED ORDER — IBUPROFEN 800 MG PO TABS
800.0000 mg | ORAL_TABLET | Freq: Once | ORAL | Status: AC
  Administered 2019-11-05: 800 mg via ORAL

## 2019-11-05 NOTE — ED Notes (Signed)
Pt received Motrin PTA

## 2019-11-05 NOTE — ED Notes (Signed)
Date and time results received: 11/05/19 1907   Test: COVID Critical Value: POSITIVE  Name of Provider Notified: Estell Harpin, MD

## 2019-11-05 NOTE — ED Notes (Signed)
Pt on room air stayed above 90% ambulating in room.

## 2019-11-05 NOTE — ED Triage Notes (Addendum)
Pt short of breath, states his whole family has covid.  Pt unable to speak in full sentences and is sweating profusely. Pt's o2 sats 86% on r/a

## 2019-11-05 NOTE — Discharge Instructions (Addendum)
You have tested POSITIVE for COVID 19. Please continue monitoring your symptoms at home. I would recommend buying a pulse oximeter (can be found on [employer]) to monitor your oxygen levels at home. If your oxygen levels are persistently < 88% you will need to come to the ED IMMEDIATELY for further evaluation.   Use the albuterol inhaler as needed for shortness of breath. You can use 2 puffs every 4 hours as needed. Take Tylenol as needed for fevers. Drink plenty of fluids to stay hydrated and continue taking your home meds including your insulin as prescribed.   I have provided your information to the [facility] - they will contact you to discuss potentially receiving monoclonal antibodies to help your body produce its own immunity quicker than it would without them. They will be able to answer any and all questions you have. Please be advised that after 10 days of symptoms onset you will no longer be a candidate.   Return to the ED IMMEDIATELY for any worsening symptoms including worsening shortness of breath, oxygen saturation < 88% persistently, severe chest pain, passing out, or any other new/concerning symptoms.

## 2019-11-05 NOTE — ED Triage Notes (Signed)
Pt presents to ED via [facility] sent from Urgent Care due to SOB. Pt states his whole family has had covid. Pt sats on RA at UC was 86%, placed on 4L.

## 2019-11-05 NOTE — ED Notes (Signed)
Patient is being discharged from the Urgent Care and sent to the Emergency Department via ambulance. Per Doyce Para, patient is in need of higher level of care due to low o2 sat. Patient is aware and verbalizes understanding of plan of care.  Vitals:   11/05/19 1514 11/05/19 1526  BP: 133/81   Pulse: (!) 136 (!) 124  Resp: (!) 28   Temp: (!) 102.9 F (39.4 C)   SpO2: (!) 86% 92%

## 2019-11-05 NOTE — ED Provider Notes (Signed)
[facility] EMERGENCY DEPARTMENT Provider Note   CSN: [id number] Arrival date & time: 11/05/19  1607     History Chief Complaint  Patient presents with  . Shortness of Breath    Kevin Sanders is a 19 y.o. male with PMHx HTN and Type 2 insulin dependent diabetes who presents to the ED via EMS from urgent care due to SOB.  Triage report patient was found to have O2 sats at 86% on room air and EMS and was sent here for further evaluation with concern for need for higher level of care due to oxygenation status.  Patient reports he began feeling ill approximately 6 days ago with a dry cough and subjective fevers.  He reports that his grandma recently tested positive for COVID-19 and he lives with her.  He has not been tested yet.  He is not vaccinated.  States he has been having some diarrhea as well, no abdominal pain, nausea, vomiting.  Patient denies any chest pain or specific shortness of breath.   The history is provided by the patient and medical records.       Past Medical History:  Diagnosis Date  . Diabetes ([place])   . Hypertension     Patient Active Problem List   Diagnosis Date Noted  . Type 2 diabetes mellitus with microalbuminuria, without long-term current use of insulin ([place]) 10/27/2019  . Ketosis-prone diabetes mellitus ([place]) 10/25/2019  . Essential hypertension 10/25/2019  . Hypertriglyceridemia 10/25/2019  . DKA (diabetic ketoacidoses) ([place]) 07/29/2019    Past Surgical History:  Procedure Laterality Date  . none         Family History  Problem Relation Age of Onset  . Bladder Cancer Neg Hx   . Kidney cancer Neg Hx   . Prostate cancer Neg Hx     Social History   Tobacco Use  . Smoking status: Never Smoker  . Smokeless tobacco: Never Used  Substance Use Topics  . Alcohol use: No  . Drug use: No    Home Medications Prior to Admission medications   Medication Sig Start Date End Date Taking? Authorizing Provider  amLODipine (NORVASC) 10 MG tablet  Take 1 tablet (10 mg total) by mouth daily. 10/25/19 01/23/20  Shamleffer, Melanie Crazier, MD  blood glucose meter kit and supplies KIT Dispense based on patient and insurance preference. Use up to four times daily as directed. (FOR ICD-9 250.00, 250.01). 08/02/19   Sreenath, Sudheer B, MD  insulin glargine (LANTUS SOLOSTAR) 100 UNIT/ML Solostar Pen Inject 70 Units into the skin daily. 10/25/19   Shamleffer, Melanie Crazier, MD  Insulin Pen Needle 32G X 4 MM MISC 1 Units by Does not apply route 4 (four) times daily -  before meals and at bedtime. 08/02/19   Sreenath, Trula Slade, MD  losartan (COZAAR) 50 MG tablet Take 1 tablet (50 mg total) by mouth daily. 10/25/19 01/23/20  Shamleffer, Melanie Crazier, MD  metFORMIN (GLUCOPHAGE-XR) 500 MG 24 hr tablet Take 2 tablets (1,000 mg total) by mouth in the morning and at bedtime. 10/25/19   Shamleffer, Melanie Crazier, MD    Allergies    Augmentin [amoxicillin-pot clavulanate]  Review of Systems   Review of Systems  Constitutional: Positive for chills, fatigue and fever.  Respiratory: Positive for cough. Negative for shortness of breath.   Cardiovascular: Negative for chest pain.  Gastrointestinal: Positive for diarrhea. Negative for abdominal pain, nausea and vomiting.  All other systems reviewed and are negative.   Physical Exam Updated Vital Signs  BP (!) 144/78 (BP Location: Right Arm)   Pulse (!) 118   Temp 99.5 F (37.5 C) (Oral)   Resp (!) 33   Ht _0  (1.753 m)   Wt 128.4 kg   SpO2 92%   BMI 41.79 kg/m   Physical Exam Vitals and nursing note reviewed.  Constitutional:      Appearance: He is obese. He is diaphoretic. He is not ill-appearing.  HENT:     Head: Normocephalic and atraumatic.  Eyes:     Conjunctiva/sclera: Conjunctivae normal.  Cardiovascular:     Rate and Rhythm: Normal rate and regular rhythm.     Pulses: Normal pulses.  Pulmonary:     Effort: Tachypnea present.     Breath sounds: Decreased breath sounds present.  No wheezing, rhonchi or rales.     Comments: Currently on 2L at 93% at rest. Able to speak in full sentences without difficulty.  Abdominal:     Palpations: Abdomen is soft.     Tenderness: There is no abdominal tenderness. There is no guarding or rebound.  Musculoskeletal:     Cervical back: Neck supple.  Skin:    General: Skin is warm.  Neurological:     Mental Status: He is alert.     ED Results / Procedures / Treatments   Labs (all labs ordered are listed, but only abnormal results are displayed) Labs Reviewed  BASIC METABOLIC PANEL - Abnormal; Notable for the following components:      Result Value   Potassium 3.4 (*)    Creatinine, Ser 1.28 (*)    Calcium 8.3 (*)    All other components within normal limits  CBC WITH DIFFERENTIAL/PLATELET - Abnormal; Notable for the following components:   RBC 6.27 (*)    MCV 75.1 (*)    MCH 24.7 (*)    All other components within normal limits  SARS CORONAVIRUS 2 BY RT PCR (HOSPITAL ORDER, [place] [facility])  CBG MONITORING, ED    EKG None  Radiology DG Chest Port 1 View  Result Date: 11/05/2019 CLINICAL DATA:  Shortness of breath, possible COVID EXAM: PORTABLE CHEST 1 VIEW COMPARISON:  07/29/2019 FINDINGS: Patchy bilateral nodular airspace disease noted. Low lung volumes. Heart is normal size. No effusions or pneumothorax. No acute bony abnormality. IMPRESSION: Patchy bilateral nodular airspace disease most compatible with COVID pneumonia. Electronically Signed   By: Rolm Baptise M.D.   On: 11/05/2019 17:25    Procedures Procedures (including critical care time)  Medications Ordered in ED Medications  albuterol (VENTOLIN HFA) 108 (90 Base) MCG/ACT inhaler 2 puff (has no administration in time range)  sodium chloride 0.9 % bolus 1,000 mL (0 mLs Intravenous Stopped 11/05/19 1821)    ED Course  I have reviewed the triage vital signs and the nursing notes.  Pertinent labs & imaging results that were  available during my care of the patient were reviewed by me and considered in my medical decision making (see chart for details).  Clinical Course as of Nov 04 1899  Fri Nov 05, 2019  1813 Pt on room air stayed above 90% ambulating in room.      [MV]    Clinical Course User Index [MV] Eustaquio Maize, PA-C   MDM Rules/Calculators/A&P                          19 year old male who presents to the ED from urgent care for shortness of breath, has  been having cough, shortness of breath, subjective fevers for the last 6 days.  Recent Covid exposure with grandmother.  Patient is unvaccinated.  Went to urgent care today and was noted to be hypoxic at 86% on room air and sent to the ED on 4 L via EMS.  On arrival to the ED is febrile at 102.9, he had recently received a Motrin at urgent care.  Will recheck.  He is tachycardic in the 110s and mildly tachypneic however he is able to speak in full sentences on his oxygen, he has been weaned down to 2 L here and satting at 93%.  He states he feels much improved with the oxygen.  We will plan to ambulate patient to assess for desat.  Will obtain Covid swab, chest x-ray, lab work at this time.  Fluids have been ordered due to tachycardia.  Patient does have a history of diabetes, recently diagnosed earlier this year.  This with his high blood pressure as well as his BMI some a good candidate for Mab.  Patient is currently thinking weighing options.  Will await Covid test prior to further discussion however do feel he could receive it here in the ED today.   CBG 93 CXR with signs of COVID PNA.  CBC without leukocytosis. Hgb stable at 15.5.  BMP with potassium 3.4, likely slightly decreased due to diarrhea pt has been having. Creatinine 1.28; pt receiving fluids at this time  Lab Results  Component Value Date   CREATININE 1.28 (H) 11/05/2019   CREATININE 1.26 10/25/2019   CREATININE 0.92 08/01/2019   Pt ambulated in the ED without supplemental O2 and  remained at 90%. On reevaluation pt resting comfortably in the bed; currently on RA at 90% however he does appear much improved from his beginning of his ED visit. He continues to be tachycardic in the low 100s however improved from previous. Per chart review pt has appeared to be tachycardic in the past not during sick visits. Will provide albuterol inhaler for patient.   COVID test has not returned yet but I am highly suspicious that it will be positive. At shift change case signed out to Evalee Jefferson, PA-C who will dispo afterwards.   Discussed monoclonal antibody infusion with pt given his BMI, diabetes, and HTN. He would like some time to think about it and is not interested in receiving it in the ED today. He is currently on day 6 of symptoms and understands that after day 10 he is no longer eligible. I have provided the [place] clinic with his information.   This note was prepared using Dragon voice recognition software and may include unintentional dictation errors due to the inherent limitations of voice recognition software.  JIYAAN STEINHAUSER was evaluated in Emergency Department on 11/05/2019 for the symptoms described in the history of present illness. He was evaluated in the context of the global COVID-19 pandemic, which necessitated consideration that the patient might be at risk for infection with the SARS-CoV-2 virus that causes COVID-19. Institutional protocols and algorithms that pertain to the evaluation of patients at risk for COVID-19 are in a state of rapid change based on information released by regulatory bodies including the CDC and federal and state organizations. These policies and algorithms were followed during the patient's care in the ED.   Final Clinical Impression(s) / ED Diagnoses Final diagnoses:  [id number]    Rx / DC Orders ED Discharge Orders    None  Eustaquio Maize, PA-C 11/05/19 Aline August, MD 11/08/19 1043

## 2019-11-06 ENCOUNTER — Encounter (HOSPITAL_COMMUNITY): Payer: Self-pay | Admitting: Emergency Medicine

## 2019-11-06 ENCOUNTER — Emergency Department (HOSPITAL_COMMUNITY): Payer: 59

## 2019-11-06 ENCOUNTER — Telehealth: Payer: Self-pay | Admitting: Unknown Physician Specialty

## 2019-11-06 ENCOUNTER — Inpatient Hospital Stay (HOSPITAL_COMMUNITY)
Admission: EM | Admit: 2019-11-06 | Discharge: 2019-11-12 | DRG: 177 | Disposition: A | Discharge status: Home / Self Care | Payer: 59 | Attending: Internal Medicine | Admitting: Internal Medicine

## 2019-11-06 ENCOUNTER — Telehealth: Payer: Self-pay | Admitting: Nurse Practitioner

## 2019-11-06 ENCOUNTER — Other Ambulatory Visit: Payer: Self-pay

## 2019-11-06 DIAGNOSIS — R7989 Other specified abnormal findings of blood chemistry: Secondary | ICD-10-CM | POA: Diagnosis present

## 2019-11-06 DIAGNOSIS — Z79899 Other long term (current) drug therapy: Secondary | ICD-10-CM

## 2019-11-06 DIAGNOSIS — E785 Hyperlipidemia, unspecified: Secondary | ICD-10-CM | POA: Diagnosis present

## 2019-11-06 DIAGNOSIS — J1282 Pneumonia due to coronavirus disease 2019: Secondary | ICD-10-CM | POA: Diagnosis present

## 2019-11-06 DIAGNOSIS — U071 COVID-19: Principal | ICD-10-CM | POA: Diagnosis present

## 2019-11-06 DIAGNOSIS — Z794 Long term (current) use of insulin: Secondary | ICD-10-CM

## 2019-11-06 DIAGNOSIS — E1129 Type 2 diabetes mellitus with other diabetic kidney complication: Secondary | ICD-10-CM | POA: Diagnosis not present

## 2019-11-06 DIAGNOSIS — E1169 Type 2 diabetes mellitus with other specified complication: Secondary | ICD-10-CM | POA: Diagnosis present

## 2019-11-06 DIAGNOSIS — E781 Pure hyperglyceridemia: Secondary | ICD-10-CM | POA: Diagnosis present

## 2019-11-06 DIAGNOSIS — R809 Proteinuria, unspecified: Secondary | ICD-10-CM | POA: Diagnosis present

## 2019-11-06 DIAGNOSIS — R197 Diarrhea, unspecified: Secondary | ICD-10-CM | POA: Diagnosis present

## 2019-11-06 DIAGNOSIS — I10 Essential (primary) hypertension: Secondary | ICD-10-CM | POA: Diagnosis present

## 2019-11-06 DIAGNOSIS — Z881 Allergy status to other antibiotic agents status: Secondary | ICD-10-CM | POA: Diagnosis not present

## 2019-11-06 DIAGNOSIS — J9601 Acute respiratory failure with hypoxia: Secondary | ICD-10-CM | POA: Diagnosis present

## 2019-11-06 DIAGNOSIS — Z6841 Body Mass Index (BMI) 40.0 and over, adult: Secondary | ICD-10-CM

## 2019-11-06 LAB — COMPREHENSIVE METABOLIC PANEL
ALT: 23 U/L (ref 0–44)
AST: 73 U/L — ABNORMAL HIGH (ref 15–41)
Albumin: 3.7 g/dL (ref 3.5–5.0)
Alkaline Phosphatase: 28 U/L — ABNORMAL LOW (ref 38–126)
Anion gap: 11 (ref 5–15)
BUN: 13 mg/dL (ref 6–20)
CO2: 22 mmol/L (ref 22–32)
Calcium: 8.2 mg/dL — ABNORMAL LOW (ref 8.9–10.3)
Chloride: 98 mmol/L (ref 98–111)
Creatinine, Ser: 1.2 mg/dL (ref 0.61–1.24)
GFR calc Af Amer: >60 mL/min (ref >60)
GFR calc non Af Amer: >60 mL/min (ref >60)
Glucose, Bld: 93 mg/dL (ref 70–99)
Potassium: 3.3 mmol/L — ABNORMAL LOW (ref 3.5–5.1)
Sodium: 131 mmol/L — ABNORMAL LOW (ref 135–145)
Total Bilirubin: 0.6 mg/dL (ref 0.3–1.2)
Total Protein: 7 g/dL (ref 6.5–8.1)

## 2019-11-06 LAB — CBC WITH DIFFERENTIAL/PLATELET
Abs Immature Granulocytes: 0.02 10*3/uL (ref 0.00–0.07)
Basophils Absolute: 0 10*3/uL (ref 0.0–0.1)
Basophils Relative: 0 %
Eosinophils Absolute: 0 10*3/uL (ref 0.0–0.5)
Eosinophils Relative: 0 %
HCT: 47.3 % (ref 39.0–52.0)
Hemoglobin: 15.6 g/dL (ref 13.0–17.0)
Immature Granulocytes: 0 %
Lymphocytes Relative: 13 %
Lymphs Abs: 0.8 10*3/uL (ref 0.7–4.0)
MCH: 24.8 pg — ABNORMAL LOW (ref 26.0–34.0)
MCHC: 33 g/dL (ref 30.0–36.0)
MCV: 75.2 fL — ABNORMAL LOW (ref 80.0–100.0)
Monocytes Absolute: 0.3 10*3/uL (ref 0.1–1.0)
Monocytes Relative: 4 %
Neutro Abs: 4.9 10*3/uL (ref 1.7–7.7)
Neutrophils Relative %: 83 %
Platelets: 156 10*3/uL (ref 150–400)
RBC: 6.29 MIL/uL — ABNORMAL HIGH (ref 4.22–5.81)
RDW: 13.7 % (ref 11.5–15.5)
WBC: 5.9 10*3/uL (ref 4.0–10.5)
nRBC: 0 % (ref 0.0–0.2)

## 2019-11-06 LAB — C-REACTIVE PROTEIN: CRP: 12.6 mg/dL — ABNORMAL HIGH (ref <1.0)

## 2019-11-06 LAB — FIBRINOGEN: Fibrinogen: 629 mg/dL — ABNORMAL HIGH (ref 210–475)

## 2019-11-06 LAB — LACTATE DEHYDROGENASE: LDH: 476 U/L — ABNORMAL HIGH (ref 98–192)

## 2019-11-06 LAB — D-DIMER, QUANTITATIVE: D-Dimer, Quant: 2.3 ug/mL-FEU — ABNORMAL HIGH (ref 0.00–0.50)

## 2019-11-06 LAB — FERRITIN: Ferritin: 650 ng/mL — ABNORMAL HIGH (ref 24–336)

## 2019-11-06 LAB — PROCALCITONIN: Procalcitonin: 0.18 ng/mL

## 2019-11-06 LAB — TRIGLYCERIDES: Triglycerides: 228 mg/dL — ABNORMAL HIGH (ref <150)

## 2019-11-06 LAB — LACTIC ACID, PLASMA
Lactic Acid, Venous: 0.9 mmol/L (ref 0.5–1.9)
Lactic Acid, Venous: 1.1 mmol/L (ref 0.5–1.9)

## 2019-11-06 MED ORDER — ONDANSETRON HCL 4 MG/2ML IJ SOLN: 4.0000 mg | Freq: Four times a day (QID) | INTRAMUSCULAR | Status: DC | PRN

## 2019-11-06 MED ORDER — ENOXAPARIN SODIUM 80 MG/0.8ML ~~LOC~~ SOLN: 0.5000 mg/kg | Freq: Two times a day (BID) | SUBCUTANEOUS | Status: DC

## 2019-11-06 MED ORDER — SODIUM CHLORIDE 0.9 % IV SOLN
100.0000 mg | Freq: Once | INTRAVENOUS | Status: AC
  Administered 2019-11-06: 100 mg via INTRAVENOUS
  Filled 2019-11-06: qty 20

## 2019-11-06 MED ORDER — INSULIN ASPART 100 UNIT/ML ~~LOC~~ SOLN
0.0000 [IU] | Freq: Every day | SUBCUTANEOUS | Status: DC
  Administered 2019-11-10: 2 [IU] via SUBCUTANEOUS

## 2019-11-06 MED ORDER — ONDANSETRON HCL 4 MG PO TABS: 4.0000 mg | ORAL_TABLET | Freq: Four times a day (QID) | ORAL | Status: DC | PRN

## 2019-11-06 MED ORDER — BARICITINIB 2 MG PO TABS
4.0000 mg | ORAL_TABLET | Freq: Every day | ORAL | Status: DC
  Administered 2019-11-06 – 2019-11-12 (×7): 4 mg via ORAL
  Filled 2019-11-06 (×8): qty 2

## 2019-11-06 MED ORDER — INSULIN GLARGINE 100 UNITS/ML SOLOSTAR PEN
40.0000 [IU] | PEN_INJECTOR | Freq: Every day | SUBCUTANEOUS | Status: DC
  Filled 2019-11-06: qty 3

## 2019-11-06 MED ORDER — BISACODYL 5 MG PO TBEC: 5.0000 mg | DELAYED_RELEASE_TABLET | Freq: Every day | ORAL | Status: DC | PRN

## 2019-11-06 MED ORDER — ACETAMINOPHEN 325 MG PO TABS
650.0000 mg | ORAL_TABLET | Freq: Once | ORAL | Status: AC
  Administered 2019-11-06: 650 mg via ORAL
  Filled 2019-11-06: qty 2

## 2019-11-06 MED ORDER — LINAGLIPTIN 5 MG PO TABS
5.0000 mg | ORAL_TABLET | Freq: Every day | ORAL | Status: DC
  Administered 2019-11-07 – 2019-11-12 (×6): 5 mg via ORAL
  Filled 2019-11-06 (×9): qty 1

## 2019-11-06 MED ORDER — ASCORBIC ACID 500 MG PO TABS
500.0000 mg | ORAL_TABLET | Freq: Every day | ORAL | Status: DC
  Administered 2019-11-06 – 2019-11-12 (×7): 500 mg via ORAL
  Filled 2019-11-06 (×8): qty 1

## 2019-11-06 MED ORDER — METHYLPREDNISOLONE SODIUM SUCC 40 MG IJ SOLR
0.5000 mg/kg | Freq: Two times a day (BID) | INTRAMUSCULAR | Status: DC
  Administered 2019-11-06 – 2019-11-07 (×2): 35.2 mg via INTRAVENOUS
  Filled 2019-11-06 (×2): qty 1

## 2019-11-06 MED ORDER — ENOXAPARIN SODIUM 80 MG/0.8ML ~~LOC~~ SOLN: 0.5000 mg/kg | SUBCUTANEOUS | Status: DC

## 2019-11-06 MED ORDER — INSULIN ASPART 100 UNIT/ML ~~LOC~~ SOLN
0.0000 [IU] | Freq: Three times a day (TID) | SUBCUTANEOUS | Status: DC
  Administered 2019-11-07 (×2): 2 [IU] via SUBCUTANEOUS
  Administered 2019-11-08 (×3): 3 [IU] via SUBCUTANEOUS
  Administered 2019-11-09: 2 [IU] via SUBCUTANEOUS
  Administered 2019-11-09 – 2019-11-10 (×3): 3 [IU] via SUBCUTANEOUS
  Administered 2019-11-10 (×2): 2 [IU] via SUBCUTANEOUS
  Administered 2019-11-11: 3 [IU] via SUBCUTANEOUS
  Filled 2019-11-06: qty 1

## 2019-11-06 MED ORDER — IOHEXOL 350 MG/ML SOLN
150.0000 mL | Freq: Once | INTRAVENOUS | Status: AC | PRN
  Administered 2019-11-06: 150 mL via INTRAVENOUS

## 2019-11-06 MED ORDER — SODIUM CHLORIDE 0.9 % IV SOLN: 100.0000 mg | Freq: Once | INTRAVENOUS | Status: DC

## 2019-11-06 MED ORDER — ALBUTEROL SULFATE HFA 108 (90 BASE) MCG/ACT IN AERS
10.0000 | INHALATION_SPRAY | RESPIRATORY_TRACT | Status: DC
  Administered 2019-11-06 – 2019-11-09 (×16): 10 via RESPIRATORY_TRACT
  Filled 2019-11-06 (×3): qty 6.7

## 2019-11-06 MED ORDER — AMLODIPINE BESYLATE 10 MG PO TABS
10.0000 mg | ORAL_TABLET | Freq: Every day | ORAL | Status: DC
  Administered 2019-11-07: 10 mg via ORAL
  Filled 2019-11-06 (×2): qty 2
  Filled 2019-11-06: qty 1

## 2019-11-06 MED ORDER — ACETAMINOPHEN 325 MG PO TABS: 650.0000 mg | ORAL_TABLET | Freq: Four times a day (QID) | ORAL | Status: DC | PRN

## 2019-11-06 MED ORDER — SODIUM CHLORIDE 0.9 % IV SOLN
100.0000 mg | Freq: Every day | INTRAVENOUS | Status: AC
  Administered 2019-11-07 – 2019-11-10 (×4): 100 mg via INTRAVENOUS
  Filled 2019-11-06 (×5): qty 20

## 2019-11-06 MED ORDER — ZINC SULFATE 220 (50 ZN) MG PO CAPS
220.0000 mg | ORAL_CAPSULE | Freq: Every day | ORAL | Status: DC
  Administered 2019-11-06 – 2019-11-12 (×7): 220 mg via ORAL
  Filled 2019-11-06 (×8): qty 1

## 2019-11-06 MED ORDER — HYDROCOD POLST-CPM POLST ER 10-8 MG/5ML PO SUER
5.0000 mL | Freq: Two times a day (BID) | ORAL | Status: DC | PRN
  Filled 2019-11-06: qty 5

## 2019-11-06 MED ORDER — GUAIFENESIN-DM 100-10 MG/5ML PO SYRP
10.0000 mL | ORAL_SOLUTION | ORAL | Status: DC | PRN
  Administered 2019-11-06: 10 mL via ORAL
  Filled 2019-11-06: qty 10

## 2019-11-06 MED ORDER — ENOXAPARIN SODIUM 80 MG/0.8ML ~~LOC~~ SOLN
0.5000 mg/kg | SUBCUTANEOUS | Status: DC
  Administered 2019-11-06: 65 mg via SUBCUTANEOUS
  Filled 2019-11-06: qty 0.8

## 2019-11-06 NOTE — ED Notes (Addendum)
Pt placed in prone positioning by RT.

## 2019-11-06 NOTE — Telephone Encounter (Signed)
Called to discuss with Kevin Sanders about Covid symptoms and the use of casirivimab/imdevimab, a combination monoclonal antibody infusion for those with mild to moderate Covid symptoms and at a high risk of hospitalization.     Pt is qualified for this infusion at the [facility] infusion center due to co-morbid conditions (as indicated below) and/or a member of an at-risk group, however declines infusion at this time. Symptoms tier reviewed as well as criteria for ending isolation.  Symptoms reviewed that would warrant ED/Hospital evaluation. Preventative practices reviewed. Patient verbalized understanding. Patient advised to call back if he decides that he does want to get infusion. Callback number to the infusion center given. Patient advised to go to Urgent care or ED with severe symptoms. Last date he would be eligible for infusion is 11/09/19.   Patient Active Problem List   Diagnosis Date Noted  . Type 2 diabetes mellitus with microalbuminuria, without long-term current use of insulin ([facility]) 10/27/2019  . Ketosis-prone diabetes mellitus ([facility]) 10/25/2019  . Essential hypertension 10/25/2019  . Hypertriglyceridemia 10/25/2019  . DKA (diabetic ketoacidoses) ([facility]) 07/29/2019    Willette Alma, AGPCNP-BC

## 2019-11-06 NOTE — ED Notes (Signed)
Pt on 15L HFNC and prone at this time.

## 2019-11-06 NOTE — ED Notes (Signed)
Pt stood up at bedside without ambulating to use urinal and upon sitting back down onto stretcher, pt was very tachypneic, had labored breathing, O2 sat 81% on 8L HFNC.

## 2019-11-06 NOTE — H&P (Addendum)
History and Physical  NOCHOLAS DAMASO AVW:[id number] DOB: January 15, 2001 DOA: 11/06/2019  Referring physician: Lynelle Smoke , ED physician PCP: Patient, No Pcp Per  Outpatient Specialists:   Patient Coming From: home  Chief Complaint: Symptomatic COVID  HPI: KEYMANI MCLEAN is a 19 y.o. male with a history of diabetes, hypertension.  Patient was seen in the ED yesterday and was diagnosed with COVID.  Started feeling ill 6 days ago.  His grandmother, who he lives with, recently got diagnosed with COVID recently, and so he decides to get tested.  He had to have worsening shortness of breath last night.  He was offered monoclonal antibodies last night in the ED, but he declined and wanted to think about it.  Currently, he feels very hypoxic and short of breath worse with ambulation.  On admission ED, he was found to be acutely hypoxic with oxygen saturations in 75%.  This quickly improved with oxygen oxygen, although he is needed escalating amount of oxygen and is currently on 8 L high flow nasal cannula.  He did not get the vaccine.  Emergency Department Course: Checks x-ray shows diffuse patchy infiltrates.  CRP 12.  D-dimer 2.3.  Fibrinogen 629.  Lactic acid 0.9 and pro calcitonin 0.18.  Review of Systems:   Pt denies any fevers, chills, nausea, vomiting, diarrhea, constipation, abdominal pain, palpitations, headache, vision changes, lightheadedness, dizziness, melena, rectal bleeding.  Review of systems are otherwise negative  Past Medical History:  Diagnosis Date  . Diabetes ([place])   . Hypertension    Past Surgical History:  Procedure Laterality Date  . none     Social History:  reports that he has never smoked. He has never used smokeless tobacco. He reports that he does not drink alcohol and does not use drugs. Patient lives at home  Allergies  Allergen Reactions  . Augmentin [Amoxicillin-Pot Clavulanate]     Family History  Problem Relation Age of Onset  . Bladder Cancer Neg Hx    . Kidney cancer Neg Hx   . Prostate cancer Neg Hx       Prior to Admission medications   Medication Sig Start Date End Date Taking? Authorizing Provider  acetaminophen (TYLENOL) 500 MG tablet Take 1,000 mg by mouth every 6 (six) hours as needed.   Yes [provider]  amLODipine (NORVASC) 10 MG tablet Take 1 tablet (10 mg total) by mouth daily. 10/25/19 01/23/20 Yes Shamleffer, Melanie Crazier, MD  blood glucose meter kit and supplies KIT Dispense based on patient and insurance preference. Use up to four times daily as directed. (FOR ICD-9 250.00, 250.01). 08/02/19  Yes Sreenath, Sudheer B, MD  insulin glargine (LANTUS SOLOSTAR) 100 UNIT/ML Solostar Pen Inject 70 Units into the skin daily. 10/25/19  Yes Shamleffer, Melanie Crazier, MD  Insulin Pen Needle 32G X 4 MM MISC 1 Units by Does not apply route 4 (four) times daily -  before meals and at bedtime. 08/02/19  Yes Sreenath, Sudheer B, MD  losartan (COZAAR) 50 MG tablet Take 1 tablet (50 mg total) by mouth daily. Patient not taking: Reported on 11/06/2019 10/25/19 01/23/20  Shamleffer, Melanie Crazier, MD  metFORMIN (GLUCOPHAGE-XR) 500 MG 24 hr tablet Take 2 tablets (1,000 mg total) by mouth in the morning and at bedtime. Patient not taking: Reported on 11/06/2019 10/25/19   Shamleffer, Melanie Crazier, MD    Physical Exam: BP 129/79   Pulse (!) 112   Temp (!) 100.6 F (38.1 C) (Oral)   Resp (!) 33  Ht 5' 9"  (1.753 m)   Wt 128.4 kg   SpO2 (!) 89%   BMI 41.79 kg/m   . General: Young male. Awake and alert and oriented x3. No acute cardiopulmonary distress.  Marland Kitchen HEENT: Normocephalic atraumatic.  Right and left ears normal in appearance.  Pupils equal, round, reactive to light. Extraocular muscles are intact. Sclerae anicteric and noninjected.  Moist mucosal membranes. No mucosal lesions.  . Neck: Neck supple without lymphadenopathy. No carotid bruits. No masses palpated.  . Cardiovascular: Regular rate with normal S1-S2 sounds. No  murmurs, rubs, gallops auscultated. No JVD.  Marland Kitchen Respiratory: Patient fairly comfortable on nasal cannula oxygen.  Diffuse Rales.  No accessory muscle use. . Abdomen: Soft, nontender, nondistended. Active bowel sounds. No masses or hepatosplenomegaly  . Skin: No rashes, lesions, or ulcerations.  Dry, warm to touch. 2+ dorsalis pedis and radial pulses. . Musculoskeletal: No calf or leg pain. All major joints not erythematous nontender.  No upper or lower joint deformation.  Good ROM.  No contractures  . Psychiatric: Intact judgment and insight. Pleasant and cooperative. . Neurologic: No focal neurological deficits. Strength is 5/5 and symmetric in upper and lower extremities.  Cranial nerves II through XII are grossly intact.           Labs on Admission: I have personally reviewed following labs and imaging studies  CBC: Recent Labs  Lab 11/05/19 1727 11/06/19 1353  WBC 4.9 5.9  NEUTROABS 3.7 4.9  HGB 15.5 15.6  HCT 47.1 47.3  MCV 75.1* 75.2*  PLT 167 384   Basic Metabolic Panel: Recent Labs  Lab 11/05/19 1727 11/06/19 1353  NA 137 131*  K 3.4* 3.3*  CL 103 98  CO2 23 22  GLUCOSE 87 93  BUN 12 13  CREATININE 1.28* 1.20  CALCIUM 8.3* 8.2*   GFR: Estimated Creatinine Clearance: 130.3 mL/min (by C-G formula based on SCr of 1.2 mg/dL). Liver Function Tests: Recent Labs  Lab 11/06/19 1353  AST 73*  ALT 23  ALKPHOS 28*  BILITOT 0.6  PROT 7.0  ALBUMIN 3.7   No results for input(s): LIPASE, AMYLASE in the last 168 hours. No results for input(s): AMMONIA in the last 168 hours. Coagulation Profile: No results for input(s): INR, PROTIME in the last 168 hours. Cardiac Enzymes: No results for input(s): CKTOTAL, CKMB, CKMBINDEX, TROPONINI in the last 168 hours. BNP (last 3 results) No results for input(s): PROBNP in the last 8760 hours. HbA1C: No results for input(s): HGBA1C in the last 72 hours. CBG: Recent Labs  Lab 11/05/19 1717  GLUCAP 93   Lipid  Profile: Recent Labs    11/06/19 1353  TRIG 228*   Thyroid Function Tests: No results for input(s): TSH, T4TOTAL, FREET4, T3FREE, THYROIDAB in the last 72 hours. Anemia Panel: Recent Labs    11/06/19 1353  FERRITIN 650*   Urine analysis:    Component Value Date/Time   COLORURINE YELLOW (A) 08/04/2019 1410   APPEARANCEUR Clear 08/18/2019 1031   LABSPEC 1.010 08/04/2019 1410   PHURINE 6.0 08/04/2019 1410   GLUCOSEU Negative 08/18/2019 1031   HGBUR LARGE (A) 08/04/2019 1410   BILIRUBINUR Negative 08/18/2019 1031   KETONESUR 5 (A) 08/04/2019 1410   PROTEINUR Trace (A) 08/18/2019 1031   PROTEINUR 100 (A) 08/04/2019 1410   NITRITE Negative 08/18/2019 1031   NITRITE NEGATIVE 08/04/2019 1410   LEUKOCYTESUR Negative 08/18/2019 1031   LEUKOCYTESUR NEGATIVE 08/04/2019 1410   Sepsis Labs: @LABRCNTIP (procalcitonin:4,lacticidven:4) ) Recent Results (from the past 240 hour(s))  SARS Coronavirus 2 by RT PCR (hospital order, performed in [facility] hospital lab) Nasopharyngeal Nasopharyngeal Swab     Status: Abnormal   Collection Time: 11/05/19  4:27 PM   Specimen: Nasopharyngeal Swab  Result Value Ref Range Status   SARS Coronavirus 2 POSITIVE (A) NEGATIVE Final    Comment: RESULT CALLED TO, READ BACK BY AND VERIFIED WITH: J APPELT,RN @1908  11/05/19 MKELLY (NOTE) SARS-CoV-2 target nucleic acids are DETECTED  SARS-CoV-2 RNA is generally detectable in upper respiratory specimens  during the acute phase of infection.  Positive results are indicative  of the presence of the identified virus, but do not rule out bacterial infection or co-infection with other pathogens not detected by the test.  Clinical correlation with patient history and  other diagnostic information is necessary to determine patient infection status.  The expected result is negative.  Fact Sheet for Patients:   StrictlyIdeas.no   Fact Sheet for Healthcare Providers:    BankingDealers.co.za    This test is not yet approved or cleared by the [place] FDA and  has been authorized for detection and/or diagnosis of SARS-CoV-2 by FDA under an Emergency Use Authorization (EUA).  This EUA will remain in effect (meaning this tes t can be used) for the duration of  the COVID-19 declaration under Section 564(b)(1) of the Act, 21 U.S.C. section 360-bbb-3(b)(1), unless the authorization is terminated or revoked sooner.  Performed at [facility], [address] [place], [place] [address]   Blood Culture (routine x 2)     Status: None (Preliminary result)   Collection Time: 11/06/19  1:56 PM   Specimen: BLOOD  Result Value Ref Range Status   Specimen Description BLOOD  Final   Special Requests NONE  Final   Culture   Final    NO GROWTH <12 HOURS Performed at [facility], [address] [place], [place] [address]    Report Status PENDING  Incomplete  Blood Culture (routine x 2)     Status: None (Preliminary result)   Collection Time: 11/06/19  2:06 PM   Specimen: BLOOD  Result Value Ref Range Status   Specimen Description BLOOD  Final   Special Requests NONE  Final   Culture   Final    NO GROWTH <12 HOURS Performed at [facility], [address], [place], [place] [address]    Report Status PENDING  Incomplete     Radiological Exams on Admission: CT Angio Chest PE W and/or Wo Contrast  Result Date: 11/06/2019 CLINICAL DATA:  Shortness of breath, oxygen desaturation, COVID-19 positive EXAM: CT ANGIOGRAPHY CHEST WITH CONTRAST TECHNIQUE: Multidetector CT imaging of the chest was performed using the standard protocol during bolus administration of intravenous contrast. Multiplanar CT image reconstructions and MIPs were obtained to evaluate the vascular anatomy. Initial contrast opacification of the pulmonary arteries was nondiagnostic for assessment for pulmonary embolism and patient was reinjected/repeated. CONTRAST:  Total of 155m  OMNIPAQUE IOHEXOL 350 MG/ML SOLN IV COMPARISON:  None FINDINGS: Cardiovascular: Aorta normal caliber without aneurysm or dissection. Heart unremarkable. No pericardial effusion. Pulmonary arteries adequately opacified following reinjection. Pulmonary arteries appear patent. No evidence of pulmonary embolism. Mediastinum/Nodes: Base of cervical region normal appearance. Esophagus unremarkable. No thoracic adenopathy. Lungs/Pleura: Patchy BILATERAL airspace infiltrates throughout all lobes consistent with multifocal pneumonia and history of COVID-19. No pleural effusion or pneumothorax. Upper Abdomen: Unremarkable Musculoskeletal: No acute osseous findings. Review of the MIP images confirms the above findings. IMPRESSION: No evidence of pulmonary embolism. Patchy BILATERAL airspace infiltrates  throughout all lobes consistent with multifocal pneumonia and COVID-19. Electronically Signed   By: Lavonia Dana M.D.   On: 11/06/2019 15:29   DG Chest Port 1 View  Result Date: 11/05/2019 CLINICAL DATA:  Shortness of breath, possible COVID EXAM: PORTABLE CHEST 1 VIEW COMPARISON:  07/29/2019 FINDINGS: Patchy bilateral nodular airspace disease noted. Low lung volumes. Heart is normal size. No effusions or pneumothorax. No acute bony abnormality. IMPRESSION: Patchy bilateral nodular airspace disease most compatible with COVID pneumonia. Electronically Signed   By: Rolm Baptise M.D.   On: 11/05/2019 17:25    EKG: Independently reviewed.  Sinus tachycardia with no ST changes.  Assessment/Plan: Principal Problem:   Acute respiratory failure with hypoxia (HCC) Active Problems:   Essential hypertension   Hypertriglyceridemia   Type 2 diabetes mellitus with microalbuminuria, without long-term current use of insulin ([place])   Pneumonia due to COVID-19 virus    This patient was discussed with the ED physician, including pertinent vitals, physical exam findings, labs, and imaging.  We also discussed care given by the ED  provider.  1. Acute respiratory failure with hypoxia a. Currently on nasal cannula high flow, but breathing fairly comfortably 2. Pneumonia due to Covid 19 Daily labs: CBC, CMP, CRP, D-dimer, ferritin, magnesium, phosphorus Steroids: Solu-Medrol 0.40m/kg BID remdesivir Proning Supplemental oxygen Zinc and vitamin C Albuterol HFA as needed Antitussives 3. Type 2 diabetes a. Continue LA insulin b. Start Trajenta c. SSI with CBGs 4. Hypertension a. Continue home regimen 5. Hyperlipidemia  DVT prophylaxis: Lovenox 0.579mkg daily Consultants:  Code Status: full Family Communication: none  Disposition Plan: pending   StTruett MainlandDO

## 2019-11-06 NOTE — ED Triage Notes (Signed)
Pt was DX with Covid yesterday and was seen for shortness of breath in the ED. Pt returns with shortness of breath with O2 sats at 75 with ambulation to the room. Pt had an O2 sat of 83 with rest, and was placed on 3L [place].

## 2019-11-06 NOTE — Telephone Encounter (Signed)
Called to Discuss with patient about Covid symptoms and the use of the monoclonal antibody infusion for those with mild to moderate Covid symptoms and at a high risk of hospitalization.     Pt appears to qualify for this infusion due to co-morbid conditions and/or a member of an at-risk group in accordance with the FDA Emergency Use Authorization.    Unable to reach pt   LMOM 

## 2019-11-06 NOTE — ED Provider Notes (Signed)
[facility] EMERGENCY DEPARTMENT Provider Note   CSN: [id number] Arrival date & time: 11/06/19  1251     History Chief Complaint  Patient presents with  . Shortness of Breath covid pos    Kevin Sanders is a 19 y.o. male.  HPI      Kevin Sanders is a 19 y.o. male with past medical history of hypertension and type II diabetes who presents to the Emergency Department complaining of increasing shortness of breath since yesterday.  He was seen here in the emergency department yesterday and diagnosed with Covid.  He states he had symptoms for 1 week prior.  He reports having nasal congestion cough sneezing and intermittent diarrhea.  He states that he lives with his  grandmother who also recently became ill, which prompted him to seek medical care yesterday.  Today, he reports having fever and chills with shortness of breath that worsens with exertion.  He was given albuterol inhaler, but states he is unable to take a deep breath which limits his ability to use the inhaler.  He denies any alcohol or drug use, he is a non-smoker.  He does not currently have a PCP.  Pt not vaccinated for Covid.       Past Medical History:  Diagnosis Date  . Diabetes ([place])   . Hypertension     Patient Active Problem List   Diagnosis Date Noted  . Type 2 diabetes mellitus with microalbuminuria, without long-term current use of insulin ([place]) 10/27/2019  . 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 Essential hypertension 10/25/2019  . Hypertriglyceridemia 10/25/2019  . DKA (diabetic ketoacidoses) ([place]) 07/29/2019    Past Surgical History:  Procedure Laterality Date  . none         Family History  Problem Relation Age of Onset  . Bladder Cancer Neg Hx   . Kidney cancer Neg Hx   . Prostate cancer Neg Hx     Social History   Tobacco Use  . Smoking status: Never Smoker  . Smokeless tobacco: Never Used  Substance Use Topics  . Alcohol use: No  . Drug use: No    Home  Medications Prior to Admission medications   Medication Sig Start Date End Date Taking? Authorizing Provider  amLODipine (NORVASC) 10 MG tablet Take 1 tablet (10 mg total) by mouth daily. 10/25/19 01/23/20 Yes Shamleffer, Melanie Crazier, MD  blood glucose meter kit and supplies KIT Dispense based on patient and insurance preference. Use up to four times daily as directed. (FOR ICD-9 250.00, 250.01). 08/02/19   Sreenath, Sudheer B, MD  insulin glargine (LANTUS SOLOSTAR) 100 UNIT/ML Solostar Pen Inject 70 Units into the skin daily. 10/25/19   Shamleffer, Melanie Crazier, MD  Insulin Pen Needle 32G X 4 MM MISC 1 Units by Does not apply route 4 (four) times daily -  before meals and at bedtime. 08/02/19   Sreenath, Trula Slade, MD  losartan (COZAAR) 50 MG tablet Take 1 tablet (50 mg total) by mouth daily. 10/25/19 01/23/20  Shamleffer, Melanie Crazier, MD  metFORMIN (GLUCOPHAGE-XR) 500 MG 24 hr tablet Take 2 tablets (1,000 mg total) by mouth in the morning and at bedtime. 10/25/19   Shamleffer, Melanie Crazier, MD    Allergies    Augmentin [amoxicillin-pot clavulanate]  Review of Systems   Review of Systems  Constitutional: Positive for chills and fever. Negative for appetite change.  HENT: Positive for congestion and rhinorrhea.   Respiratory: Positive for cough and shortness of breath.  Cardiovascular: Negative for chest pain.  Gastrointestinal: Positive for diarrhea. Negative for abdominal pain and vomiting.  Genitourinary: Negative for difficulty urinating, dysuria and flank pain.  Musculoskeletal: Positive for arthralgias and joint swelling. Negative for neck pain and neck stiffness.  Skin: Negative for color change and wound.  Neurological: Negative for dizziness, seizures, weakness and numbness.  Psychiatric/Behavioral: Negative for confusion.    Physical Exam Updated Vital Signs BP (!) 141/78 (BP Location: Left Arm)   Pulse (!) 119   Temp (!) 101.9 F (38.8 C) (Oral)   Resp (!) 25    Ht 5' 9" (1.753 m)   Wt 128.4 kg   SpO2 (!) 89%   BMI 41.79 kg/m   Physical Exam Vitals and nursing note reviewed.  Constitutional:      Comments: Patient is nontoxic, but uncomfortable appearing.  HENT:     Nose: Nose normal.     Mouth/Throat:     Mouth: Mucous membranes are moist.     Pharynx: Oropharynx is clear. No oropharyngeal exudate or posterior oropharyngeal erythema.  Eyes:     Conjunctiva/sclera: Conjunctivae normal.  Cardiovascular:     Rate and Rhythm: Regular rhythm. Tachycardia present.     Pulses: Normal pulses.  Pulmonary:     Breath sounds: No wheezing, rhonchi or rales.     Comments: Lungs are clear to auscultation bilaterally, breathing unlabored with speech. Abdominal:     General: There is no distension.     Palpations: Abdomen is soft.     Tenderness: There is no abdominal tenderness. There is no right CVA tenderness or left CVA tenderness.  Musculoskeletal:        General: Normal range of motion.     Cervical back: Normal range of motion. No rigidity or tenderness.     Right lower leg: No edema.     Left lower leg: No edema.  Lymphadenopathy:     Cervical: No cervical adenopathy.  Skin:    General: Skin is warm.     Capillary Refill: Capillary refill takes less than 2 seconds.     Findings: No erythema or rash.  Neurological:     General: No focal deficit present.     Mental Status: He is alert.     Sensory: No sensory deficit.     Motor: No weakness.     ED Results / Procedures / Treatments   Labs (all labs ordered are listed, but only abnormal results are displayed) Labs Reviewed  CBC WITH DIFFERENTIAL/PLATELET - Abnormal; Notable for the following components:      Result Value   RBC 6.29 (*)    MCV 75.2 (*)    MCH 24.8 (*)    All other components within normal limits  COMPREHENSIVE METABOLIC PANEL - Abnormal; Notable for the following components:   Sodium 131 (*)    Potassium 3.3 (*)    Calcium 8.2 (*)    AST 73 (*)    Alkaline  Phosphatase 28 (*)    All other components within normal limits  D-DIMER, QUANTITATIVE (NOT AT [facility]) - Abnormal; Notable for the following components:   D-Dimer, Quant 2.30 (*)    All other components within normal limits  LACTATE DEHYDROGENASE - Abnormal; Notable for the following components:   LDH 476 (*)    All other components within normal limits  FERRITIN - Abnormal; Notable for the following components:   Ferritin 650 (*)    All other components within normal limits  TRIGLYCERIDES - Abnormal; Notable for  the following components:   Triglycerides 228 (*)    All other components within normal limits  FIBRINOGEN - Abnormal; Notable for the following components:   Fibrinogen 629 (*)    All other components within normal limits  C-REACTIVE PROTEIN - Abnormal; Notable for the following components:   CRP 12.6 (*)    All other components within normal limits  CULTURE, BLOOD (ROUTINE X 2)  CULTURE, BLOOD (ROUTINE X 2)  LACTIC ACID, PLASMA  PROCALCITONIN  LACTIC ACID, PLASMA    EKG EKG Interpretation  Date/Time:  Saturday November 06 2019 13:20:59 EDT Ventricular Rate:  118 PR Interval:    QRS Duration: 88 QT Interval:  317 QTC Calculation: 445 R Axis:   64 Text Interpretation: Sinus tachycardia Minimal ST depression, inferior leads Confirmed by Noemi Chapel [phone]) on 11/06/2019 3:55:05 PM   Radiology CT Angio Chest PE W and/or Wo Contrast  Result Date: 11/06/2019 CLINICAL DATA:  Shortness of breath, oxygen desaturation, COVID-19 positive EXAM: CT ANGIOGRAPHY CHEST WITH CONTRAST TECHNIQUE: Multidetector CT imaging of the chest was performed using the standard protocol during bolus administration of intravenous contrast. Multiplanar CT image reconstructions and MIPs were obtained to evaluate the vascular anatomy. Initial contrast opacification of the pulmonary arteries was nondiagnostic for assessment for pulmonary embolism and patient was reinjected/repeated. CONTRAST:  Total  of 15m OMNIPAQUE IOHEXOL 350 MG/ML SOLN IV COMPARISON:  None FINDINGS: Cardiovascular: Aorta normal caliber without aneurysm or dissection. Heart unremarkable. No pericardial effusion. Pulmonary arteries adequately opacified following reinjection. Pulmonary arteries appear patent. No evidence of pulmonary embolism. Mediastinum/Nodes: Base of cervical region normal appearance. Esophagus unremarkable. No thoracic adenopathy. Lungs/Pleura: Patchy BILATERAL airspace infiltrates throughout all lobes consistent with multifocal pneumonia and history of COVID-19. No pleural effusion or pneumothorax. Upper Abdomen: Unremarkable Musculoskeletal: No acute osseous findings. Review of the MIP images confirms the above findings. IMPRESSION: No evidence of pulmonary embolism. Patchy BILATERAL airspace infiltrates throughout all lobes consistent with multifocal pneumonia and COVID-19. Electronically Signed   By: MLavonia DanaM.D.   On: 11/06/2019 15:29   DG Chest Port 1 View  Result Date: 11/05/2019 CLINICAL DATA:  Shortness of breath, possible COVID EXAM: PORTABLE CHEST 1 VIEW COMPARISON:  07/29/2019 FINDINGS: Patchy bilateral nodular airspace disease noted. Low lung volumes. Heart is normal size. No effusions or pneumothorax. No acute bony abnormality. IMPRESSION: Patchy bilateral nodular airspace disease most compatible with COVID pneumonia. Electronically Signed   By: KRolm BaptiseM.D.   On: 11/05/2019 17:25    Procedures Procedures (including critical care time)  Medications Ordered in ED Medications  acetaminophen (TYLENOL) tablet 650 mg (has no administration in time range)    ED Course  I have reviewed the triage vital signs and the nursing notes.  Pertinent labs & imaging results that were available during my care of the patient were reviewed by me and considered in my medical decision making (see chart for details).    MDM Rules/Calculators/A&P                          Patient was seen here  yesterday and diagnosed with COVID-19.  Chest x-ray from yesterday also confirms likely Covid related pneumonia.  On arrival, O2 sat on room air was 75% after walking to the exam room, increased to 83% upon rest.  Patient was then placed on 4 L O2 by nasal cannula and O2 sat during my exam was 90-91%.  With speech O2 sat sat drops to 88  to 89%.  Will obtain covid related labs and he will need admission for his hypoxia.    1450  On recheck, pt having increased work of breathing. Sats now 87-88%, requiring O2  of 6L, will start HFNC.  Dimer elevated, will get CT angio chest to further assess for PE.   Pt O2 sats at 89% on 8L HF oxygen.  Continues to have work of breathing on return from [place].    CT angio chest negative for PE.  Consulted hospitalist, Dr. Nehemiah Settle who agrees to admit.    Final Clinical Impression(s) / ED Diagnoses Final diagnoses:  Acute hypoxemic respiratory failure due to COVID-19 [facility])    Rx / DC Orders ED Discharge Orders    None       Bufford Lope 11/06/19 1715    Davonna Belling, MD 11/07/19 [phone]

## 2019-11-06 NOTE — ED Notes (Signed)
Tammy Triplett, PA notified of pt's increased O2 need up to 6L [place]. O2 sat between 87-88% on the 6L. RT was called and setting up HFNC 8L for pt.

## 2019-11-07 ENCOUNTER — Inpatient Hospital Stay (HOSPITAL_COMMUNITY): Payer: 59

## 2019-11-07 DIAGNOSIS — R7989 Other specified abnormal findings of blood chemistry: Secondary | ICD-10-CM

## 2019-11-07 DIAGNOSIS — J9601 Acute respiratory failure with hypoxia: Secondary | ICD-10-CM

## 2019-11-07 LAB — COMPREHENSIVE METABOLIC PANEL
ALT: 22 U/L (ref 0–44)
AST: 66 U/L — ABNORMAL HIGH (ref 15–41)
Albumin: 3.4 g/dL — ABNORMAL LOW (ref 3.5–5.0)
Alkaline Phosphatase: 25 U/L — ABNORMAL LOW (ref 38–126)
Anion gap: 12 (ref 5–15)
BUN: 14 mg/dL (ref 6–20)
CO2: 20 mmol/L — ABNORMAL LOW (ref 22–32)
Calcium: 8.2 mg/dL — ABNORMAL LOW (ref 8.9–10.3)
Chloride: 103 mmol/L (ref 98–111)
Creatinine, Ser: 0.92 mg/dL (ref 0.61–1.24)
GFR calc Af Amer: >60 mL/min (ref >60)
GFR calc non Af Amer: >60 mL/min (ref >60)
Glucose, Bld: 114 mg/dL — ABNORMAL HIGH (ref 70–99)
Potassium: 3.9 mmol/L (ref 3.5–5.1)
Sodium: 135 mmol/L (ref 135–145)
Total Bilirubin: 0.6 mg/dL (ref 0.3–1.2)
Total Protein: 6.8 g/dL (ref 6.5–8.1)

## 2019-11-07 LAB — CBC WITH DIFFERENTIAL/PLATELET
Abs Immature Granulocytes: 0.02 10*3/uL (ref 0.00–0.07)
Basophils Absolute: 0 10*3/uL (ref 0.0–0.1)
Basophils Relative: 0 %
Eosinophils Absolute: 0 10*3/uL (ref 0.0–0.5)
Eosinophils Relative: 0 %
HCT: 46.1 % (ref 39.0–52.0)
Hemoglobin: 15.2 g/dL (ref 13.0–17.0)
Immature Granulocytes: 0 %
Lymphocytes Relative: 12 %
Lymphs Abs: 0.6 10*3/uL — ABNORMAL LOW (ref 0.7–4.0)
MCH: 24.8 pg — ABNORMAL LOW (ref 26.0–34.0)
MCHC: 33 g/dL (ref 30.0–36.0)
MCV: 75.2 fL — ABNORMAL LOW (ref 80.0–100.0)
Monocytes Absolute: 0.1 10*3/uL (ref 0.1–1.0)
Monocytes Relative: 3 %
Neutro Abs: 4.3 10*3/uL (ref 1.7–7.7)
Neutrophils Relative %: 85 %
Platelets: 171 10*3/uL (ref 150–400)
RBC: 6.13 MIL/uL — ABNORMAL HIGH (ref 4.22–5.81)
RDW: 13.8 % (ref 11.5–15.5)
WBC: 5.1 10*3/uL (ref 4.0–10.5)
nRBC: 0 % (ref 0.0–0.2)

## 2019-11-07 LAB — GLUCOSE, CAPILLARY
Glucose-Capillary: 138 mg/dL — ABNORMAL HIGH (ref 70–99)
Glucose-Capillary: 141 mg/dL — ABNORMAL HIGH (ref 70–99)
Glucose-Capillary: 109 mg/dL — ABNORMAL HIGH (ref 70–99)
Glucose-Capillary: 147 mg/dL — ABNORMAL HIGH (ref 70–99)

## 2019-11-07 LAB — C-REACTIVE PROTEIN: CRP: 14.7 mg/dL — ABNORMAL HIGH (ref <1.0)

## 2019-11-07 LAB — MAGNESIUM: Magnesium: 2.1 mg/dL (ref 1.7–2.4)

## 2019-11-07 LAB — CBG MONITORING, ED: Glucose-Capillary: 117 mg/dL — ABNORMAL HIGH (ref 70–99)

## 2019-11-07 LAB — PHOSPHORUS: Phosphorus: 3.3 mg/dL (ref 2.5–4.6)

## 2019-11-07 LAB — D-DIMER, QUANTITATIVE: D-Dimer, Quant: 2.47 ug/mL-FEU — ABNORMAL HIGH (ref 0.00–0.50)

## 2019-11-07 LAB — ABO/RH: ABO/RH(D): A POS

## 2019-11-07 MED ORDER — METOPROLOL TARTRATE 5 MG/5ML IV SOLN: 5.0000 mg | Freq: Three times a day (TID) | INTRAVENOUS | Status: DC | PRN

## 2019-11-07 MED ORDER — ENOXAPARIN SODIUM 80 MG/0.8ML ~~LOC~~ SOLN
0.5000 mg/kg | Freq: Two times a day (BID) | SUBCUTANEOUS | Status: DC
  Administered 2019-11-07 – 2019-11-08 (×3): 65 mg via SUBCUTANEOUS
  Filled 2019-11-07 (×3): qty 0.8

## 2019-11-07 MED ORDER — METHYLPREDNISOLONE SODIUM SUCC 125 MG IJ SOLR
80.0000 mg | Freq: Two times a day (BID) | INTRAMUSCULAR | Status: DC
  Administered 2019-11-07 – 2019-11-10 (×6): 80 mg via INTRAVENOUS
  Filled 2019-11-07 (×6): qty 2

## 2019-11-07 MED ORDER — METHYLPREDNISOLONE SODIUM SUCC 125 MG IJ SOLR: 0.5000 mg/kg | Freq: Two times a day (BID) | INTRAMUSCULAR | Status: DC

## 2019-11-07 MED ORDER — INSULIN ASPART 100 UNIT/ML ~~LOC~~ SOLN
4.0000 [IU] | Freq: Three times a day (TID) | SUBCUTANEOUS | Status: DC
  Administered 2019-11-07 – 2019-11-11 (×11): 4 [IU] via SUBCUTANEOUS

## 2019-11-07 MED ORDER — METOPROLOL TARTRATE 25 MG PO TABS
25.0000 mg | ORAL_TABLET | Freq: Two times a day (BID) | ORAL | Status: DC
  Administered 2019-11-07 – 2019-11-12 (×11): 25 mg via ORAL
  Filled 2019-11-07 (×11): qty 1

## 2019-11-07 MED ORDER — LOPERAMIDE HCL 2 MG PO CAPS: 2.0000 mg | ORAL_CAPSULE | ORAL | Status: DC | PRN

## 2019-11-07 MED ORDER — INSULIN GLARGINE 100 UNIT/ML ~~LOC~~ SOLN
40.0000 [IU] | Freq: Every day | SUBCUTANEOUS | Status: DC
  Administered 2019-11-07 – 2019-11-09 (×3): 40 [IU] via SUBCUTANEOUS
  Filled 2019-11-07 (×6): qty 0.4

## 2019-11-07 MED ORDER — PNEUMOCOCCAL VAC POLYVALENT 25 MCG/0.5ML IJ INJ
0.5000 mL | INJECTION | INTRAMUSCULAR | Status: DC
  Filled 2019-11-07: qty 0.5

## 2019-11-07 NOTE — Progress Notes (Signed)
   11/07/19 1000  Assess: MEWS Score  Temp 98.7 F (37.1 C)  BP (!) 155/98  Pulse Rate (!) 111  ECG Heart Rate (!) 112  Resp (!) 31  SpO2 (!) 88 %  Assess: MEWS Score  MEWS Temp 0  MEWS Systolic 0  MEWS Pulse 2  MEWS RR 2  MEWS LOC 0  MEWS Score 4  MEWS Score Color Red  Assess: if the MEWS score is Yellow or Red  Were vital signs taken at a resting state? Yes  Focused Assessment No change from prior assessment  Early Detection of Sepsis Score *See Row Information* Low  MEWS guidelines implemented *See Row Information* Yes  Patient oob in chair eating late breakfast, no s/s of distress reported by patient.

## 2019-11-07 NOTE — Progress Notes (Signed)
PROGRESS NOTE                                                                                                                                                                                                             Patient Demographics:    Kevin Sanders, is a 19 y.o. male, DOB - [birth year], ZOX:[id number]  Outpatient Primary MD for the patient is Patient, No Pcp Per    LOS - 1  Admit date - 11/06/2019    Chief Complaint  Patient presents with  . Shortness of Breath covid pos       Brief Narrative - Kevin Sanders is a 19 y.o. male with a history of diabetes, hypertension.  Patient was seen in the ED yesterday and was diagnosed with COVID.  Started feeling ill 6 days ago, he is not vaccinated for Covid and was exposed to sick family members.  When [facility] ER he was placed on 8 L oxygen, despite aggressive treatment he continued to get worse and was [facility] on 15 L of oxygen.   Subjective:    Kevin Sanders today has, No headache, No chest pain, No abdominal pain - No Nausea, No new weakness tingling or numbness, ++ Cough - SOB.    Assessment  & Plan :     1. Acute Hypoxic Resp. Failure due to Acute Covid 19 Viral Pneumonitis during the ongoing 2020 Covid 19 Pandemic - he is unfortunately not vaccinated and has incurred severe parenchymal lung injury due to COVID-19 pneumonia.  He has been placed on high-dose IV steroids, remdesivir and persistent.  He is overall quite tenuous.  If any further decline we will put him on heated high flow.  Encouraged the patient to sit up in chair in the daytime use I-S and flutter valve for pulmonary toiletry and then prone in bed when at night.  Will advance activity and titrate down oxygen as possible.  SpO2: (!) 88 % O2 Flow Rate (L/min): 15 L/min  Recent Labs  Lab 11/05/19 1627 11/05/19 1727 11/06/19 1353 11/06/19 1551 11/07/19 0535  11/07/19 0536  WBC  --  4.9 5.9  --  5.1  --   PLT  --  167 156  --  171  --   CRP  --   --  12.6*  --   --  14.7*  AST  --   --  73*  --  66*  --   ALT  --   --  23  --  22  --   ALKPHOS  --   --  28*  --  25*  --   BILITOT  --   --  0.6  --  0.6  --   ALBUMIN  --   --  3.7  --  3.4*  --   DDIMER  --   --  2.30*  --  2.47*  --   PROCALCITON  --   --  0.18  --   --   --   LATICACIDVEN  --   --  0.9 1.1  --   --   SARSCOV2NAA POSITIVE*  --   --   --   --   --     2.  Morbid obesity.  BMI of 41.  Follow with PCP for weight loss.  3.  Essential hypertension.  Currently on Norvasc.  4.  Moderately elevated D-dimer due to intense inflammation.  On weight-based Lovenox, if further increase will switch to full dose as the risk of developing a blood clot is very high.   5.  DM type II.  Continue combination of Lantus, sliding scale, and linagliptin, added premeal NovoLog for better control as well.  Lab Results  Component Value Date   HGBA1C 6.1 (A) 10/25/2019   CBG (last 3)  Recent Labs    11/05/19 1717 11/07/19 0823 11/07/19 0923  GLUCAP 93 117* 138*      Condition - Extremely Guarded  Family Communication  : Mother Elmarie Shiley [phone] on 11/07/2019  Code Status :  Full  Consults  :  None  Procedures  :  None  PUD Prophylaxis : None  Disposition Plan  :    Status is: Inpatient  Remains inpatient appropriate because:IV treatments appropriate due to intensity of illness or inability to take PO   Dispo: The patient is from: Home              Anticipated d/c is to: Home              Anticipated d/c date is: > 3 days              Patient currently is not medically stable to d/c.   DVT Prophylaxis  :  Lovenox   Lab Results  Component Value Date   PLT 171 11/07/2019    Diet :  Diet Order            Diet Carb Modified Fluid consistency: Thin; Room service appropriate? Yes  Diet effective now                  Inpatient Medications  Scheduled  Meds: . albuterol  10 puff Inhalation Q4H  . amLODipine  10 mg Oral Daily  . vitamin C  500 mg Oral Daily  . baricitinib  4 mg Oral Daily  . enoxaparin (LOVENOX) injection  0.5 mg/kg Subcutaneous Q12H  . insulin aspart  0-15 Units Subcutaneous TID WC  . insulin aspart  0-5 Units Subcutaneous QHS  . insulin glargine  40 Units Subcutaneous Daily  . linagliptin  5 mg Oral Daily  . methylPREDNISolone (SOLU-MEDROL) injection  80 mg Intravenous Q12H  . zinc sulfate  220 mg Oral Daily   Continuous Infusions: . remdesivir 100 mg in NS 100 mL 100 mg (11/07/19 0936)   PRN Meds:.bisacodyl, chlorpheniramine-HYDROcodone,  guaiFENesin-dextromethorphan, loperamide, [DISCONTINUED] ondansetron **OR** ondansetron (ZOFRAN) IV  Antibiotics  :    Anti-infectives (From admission, onward)   Start     Dose/Rate Route Frequency Ordered Stop   11/07/19 1000  remdesivir 100 mg in sodium chloride 0.9 % 100 mL IVPB       "Followed by" Linked Group Details   100 mg 100 mL/hr over 60 Minutes Intravenous Daily 11/06/19 1638 11/11/19 0959   11/06/19 1800  remdesivir 100 mg in sodium chloride 0.9 % 100 mL IVPB       "Followed by" Linked Group Details   100 mg 100 mL/hr over 60 Minutes Intravenous Once 11/06/19 1638 11/06/19 1940   11/06/19 1700  remdesivir 100 mg in sodium chloride 0.9 % 100 mL IVPB  Status:  Discontinued        100 mg 200 mL/hr over 30 Minutes Intravenous Once 11/06/19 1633 11/06/19 1635   11/06/19 1700  remdesivir 100 mg in sodium chloride 0.9 % 100 mL IVPB       "Followed by" Linked Group Details   100 mg 100 mL/hr over 60 Minutes Intravenous Once 11/06/19 1638 11/06/19 1819       Time Spent in minutes  30   Susa Raring M.D on 11/07/2019 at 10:58 AM  To page go to [email or web address] - password [email or web address]  [facility] -  Office  [phone]   See all Orders from today for further details    Objective:   Vitals:   11/06/19 2315 11/07/19 0330 11/07/19 0919 11/07/19 1000  BP:   129/88 (!) 146/87 (!) 155/98  Pulse: (!) 108 (!) 122 (!) 113 (!) 111  Resp:  (!) 32 (!) 28 (!) 31  Temp:   98.6 F (37 C) 98.7 F (37.1 C)  TempSrc:   Oral Oral  SpO2: 95% 91% 90% (!) 88%  Weight:      Height:        Wt Readings from Last 3 Encounters:  11/06/19 128.4 kg  11/05/19 128.4 kg  11/05/19 128 kg     Intake/Output Summary (Last 24 hours) at 11/07/2019 1058 Last data filed at 11/07/2019 0404 Gross per 24 hour  Intake 201.4 ml  Output 1500 ml  Net -1298.6 ml     Physical Exam  Awake Alert, No new F.N deficits, Normal affect Paul.AT,PERRAL Supple Neck,No JVD, No cervical lymphadenopathy appriciated.  Symmetrical Chest wall movement, Good air movement bilaterally, CTAB RRR,No Gallops,Rubs or new Murmurs, No Parasternal Heave +ve B.Sounds, Abd Soft, No tenderness, No organomegaly appriciated, No rebound - guarding or rigidity. No Cyanosis, Clubbing or edema, No new Rash or bruise      Data Review:    CBC Recent Labs  Lab 11/05/19 1727 11/06/19 1353 11/07/19 0535  WBC 4.9 5.9 5.1  HGB 15.5 15.6 15.2  HCT 47.1 47.3 46.1  PLT 167 156 171  MCV 75.1* 75.2* 75.2*  MCH 24.7* 24.8* 24.8*  MCHC 32.9 33.0 33.0  RDW 13.8 13.7 13.8  LYMPHSABS 0.8 0.8 0.6*  MONOABS 0.3 0.3 0.1  EOSABS 0.0 0.0 0.0  BASOSABS 0.0 0.0 0.0    Recent Labs  Lab 11/05/19 1727 11/06/19 1353 11/06/19 1551 11/07/19 0535 11/07/19 0536  NA 137 131*  --  135  --   K 3.4* 3.3*  --  3.9  --   CL 103 98  --  103  --   CO2 23 22  --  20*  --   GLUCOSE 87 93  --  114*  --  BUN 12 13  --  14  --   CREATININE 1.28* 1.20  --  0.92  --   CALCIUM 8.3* 8.2*  --  8.2*  --   AST  --  73*  --  66*  --   ALT  --  23  --  22  --   ALKPHOS  --  28*  --  25*  --   BILITOT  --  0.6  --  0.6  --   ALBUMIN  --  3.7  --  3.4*  --   MG  --   --   --  2.1  --   CRP  --  12.6*  --   --  14.7*  DDIMER  --  2.30*  --  2.47*  --   PROCALCITON  --  0.18  --   --   --   LATICACIDVEN  --  0.9 1.1  --    --     ------------------------------------------------------------------------------------------------------------------ Recent Labs    11/06/19 1353  TRIG 228*    Lab Results  Component Value Date   HGBA1C 6.1 (A) 10/25/2019   ------------------------------------------------------------------------------------------------------------------ No results for input(s): TSH, T4TOTAL, T3FREE, THYROIDAB in the last 72 hours.  Invalid input(s): FREET3  Cardiac Enzymes No results for input(s): CKMB, TROPONINI, MYOGLOBIN in the last 168 hours.  Invalid input(s): CK ------------------------------------------------------------------------------------------------------------------ No results found for: BNP  Micro Results Recent Results (from the past 240 hour(s))  SARS Coronavirus 2 by RT PCR (hospital order, performed in [facility] hospital lab) Nasopharyngeal Nasopharyngeal Swab     Status: Abnormal   Collection Time: 11/05/19  4:27 PM   Specimen: Nasopharyngeal Swab  Result Value Ref Range Status   SARS Coronavirus 2 POSITIVE (A) NEGATIVE Final    Comment: RESULT CALLED TO, READ BACK BY AND VERIFIED WITH: J APPELT,RN  11/05/19 MKELLY (NOTE) SARS-CoV-2 target nucleic acids are DETECTED  SARS-CoV-2 RNA is generally detectable in upper respiratory specimens  during the acute phase of infection.  Positive results are indicative  of the presence of the identified virus, but do not rule out bacterial infection or co-infection with other pathogens not detected by the test.  Clinical correlation with patient history and  other diagnostic information is necessary to determine patient infection status.  The expected result is negative.  Fact Sheet for Patients:   BoilerBrush.com.cy   Fact Sheet for Healthcare Providers:   https://pope.com/    This test is not yet approved or cleared by the [place] FDA and  has been  authorized for detection and/or diagnosis of SARS-CoV-2 by FDA under an Emergency Use Authorization (EUA).  This EUA will remain in effect (meaning this tes t can be used) for the duration of  the COVID-19 declaration under Section 564(b)(1) of the Act, 21 U.S.C. section 360-bbb-3(b)(1), unless the authorization is terminated or revoked sooner.  Performed at [facility], [address] [place], [place] [address]   Blood Culture (routine x 2)     Status: None (Preliminary result)   Collection Time: 11/06/19  1:56 PM   Specimen: BLOOD  Result Value Ref Range Status   Specimen Description BLOOD  Final   Special Requests NONE  Final   Culture   Final    NO GROWTH < 24 HOURS Performed at [facility], [address], [place], [place] [address]    Report Status PENDING  Incomplete  Blood Culture (routine x 2)     Status: None (Preliminary result)  Collection Time: 11/06/19  2:06 PM   Specimen: BLOOD  Result Value Ref Range Status   Specimen Description BLOOD  Final   Special Requests NONE  Final   Culture   Final    NO GROWTH < 24 HOURS Performed at [facility], [address] [place], [place] [address]    Report Status PENDING  Incomplete    Radiology Reports CT Angio Chest PE W and/or Wo Contrast  Result Date: 11/06/2019 CLINICAL DATA:  Shortness of breath, oxygen desaturation, COVID-19 positive EXAM: CT ANGIOGRAPHY CHEST WITH CONTRAST TECHNIQUE: Multidetector CT imaging of the chest was performed using the standard protocol during bolus administration of intravenous contrast. Multiplanar CT image reconstructions and MIPs were obtained to evaluate the vascular anatomy. Initial contrast opacification of the pulmonary arteries was nondiagnostic for assessment for pulmonary embolism and patient was reinjected/repeated. CONTRAST:  Total of OMNIPAQUE IOHEXOL 350 MG/ML SOLN IV COMPARISON:  None FINDINGS: Cardiovascular: Aorta normal caliber without aneurysm or dissection. Heart  unremarkable. No pericardial effusion. Pulmonary arteries adequately opacified following reinjection. Pulmonary arteries appear patent. No evidence of pulmonary embolism. Mediastinum/Nodes: Base of cervical region normal appearance. Esophagus unremarkable. No thoracic adenopathy. Lungs/Pleura: Patchy BILATERAL airspace infiltrates throughout all lobes consistent with multifocal pneumonia and history of COVID-19. No pleural effusion or pneumothorax. Upper Abdomen: Unremarkable Musculoskeletal: No acute osseous findings. Review of the MIP images confirms the above findings. IMPRESSION: No evidence of pulmonary embolism. Patchy BILATERAL airspace infiltrates throughout all lobes consistent with multifocal pneumonia and COVID-19. Electronically Signed   By: Ulyses Southward M.D.   On: 11/06/2019 15:29   DG Chest Port 1 View  Result Date: 11/05/2019 CLINICAL DATA:  Shortness of breath, possible COVID EXAM: PORTABLE CHEST 1 VIEW COMPARISON:  07/29/2019 FINDINGS: Patchy bilateral nodular airspace disease noted. Low lung volumes. Heart is normal size. No effusions or pneumothorax. No acute bony abnormality. IMPRESSION: Patchy bilateral nodular airspace disease most compatible with COVID pneumonia. Electronically Signed   By: Charlett Nose M.D.   On: 11/05/2019 17:25

## 2019-11-07 NOTE — Progress Notes (Signed)
   11/07/19 1337  Assess: MEWS Score  Temp (!) 97.2 F (36.2 C)  BP 132/87  Pulse Rate (!) 104  ECG Heart Rate (!) 104  Resp (!) 21  SpO2 94 %  Assess: MEWS Score  MEWS Temp 0  MEWS Systolic 0  MEWS Pulse 1  MEWS RR 1  MEWS LOC 0  MEWS Score 2  MEWS Score Color Yellow  Assess: if the MEWS score is Yellow or Red  Were vital signs taken at a resting state? Yes  Focused Assessment No change from prior assessment  Early Detection of Sepsis Score *See Row Information* Low  MEWS guidelines implemented *See Row Information* No, vital signs rechecked  Patient received additional cardiac medication, another dose of inhaler and oob in chair, patient is medically stable will continue to monitor.

## 2019-11-07 NOTE — Progress Notes (Signed)
°   11/07/19 0919  Assess: MEWS Score  Temp 98.6 F (37 C)  BP (!) 146/87  Pulse Rate (!) 113  ECG Heart Rate (!) 113  Resp (!) 28  Level of Consciousness Alert  SpO2 90 %  O2 Device HFNC  O2 Flow Rate (L/min) 15 L/min  Assess: MEWS Score  MEWS Temp 0  MEWS Systolic 0  MEWS Pulse 2  MEWS RR 2  MEWS LOC 0  MEWS Score 4  MEWS Score Color Red  Assess: if the MEWS score is Yellow or Red  Were vital signs taken at a resting state? Yes  Focused Assessment No change from prior assessment  Early Detection of Sepsis Score *See Row Information* Low  MEWS guidelines implemented *See Row Information* Yes  Patient is a new admit on to unit md rounding at time of scoring restarted patient home blood pressure medication, placed patient on 15 liters HF and ordered medications, interventions completed awaitng results.

## 2019-11-07 NOTE — Plan of Care (Signed)
  Problem: Education: Goal: Knowledge of risk factors and measures for prevention of condition will improve Outcome: Progressing   Problem: Coping: Goal: Psychosocial and spiritual needs will be supported Outcome: Progressing   Problem: Respiratory: Goal: Will maintain a patent airway Outcome: Progressing Goal: Complications related to the disease process, condition or treatment will be avoided or minimized Outcome: Progressing   

## 2019-11-07 NOTE — Progress Notes (Signed)
   11/07/19 1237  Assess: MEWS Score  Temp 98 F (36.7 C)  BP (!) 148/97  Pulse Rate (!) 113  ECG Heart Rate (!) 113  Resp (!) 26  SpO2 91 %  Assess: MEWS Score  MEWS Temp 0  MEWS Systolic 0  MEWS Pulse 2  MEWS RR 2  MEWS LOC 0  MEWS Score 4  MEWS Score Color Red  Assess: if the MEWS score is Yellow or Red  Were vital signs taken at a resting state? Yes  Focused Assessment No change from prior assessment  Early Detection of Sepsis Score *See Row Information* Low  MEWS guidelines implemented *See Row Information* Yes  Patient placed on heated high flow by respiratory patient demonstrated positive response with sustaining sats above 90%.

## 2019-11-07 NOTE — Progress Notes (Signed)
PROGRESS NOTE    Kevin Sanders  QQV:[id number] DOB: Nov 17, 2000 DOA: 11/06/2019 PCP: Patient, No Pcp Per   Brief Narrative:  Per HPI:  Kevin Sanders is a 19 y.o. male with a history of diabetes, hypertension.  Patient was seen in the ED yesterday and was diagnosed with COVID.  Started feeling ill 6 days ago.  His grandmother, who he lives with, recently got diagnosed with COVID recently, and so he decides to get tested.  He had to have worsening shortness of breath last night.  He was offered monoclonal antibodies last night in the ED, but he declined and wanted to think about it.  Currently, he feels very hypoxic and short of breath worse with ambulation.  On admission ED, he was found to be acutely hypoxic with oxygen saturations in 75%.  This quickly improved with oxygen oxygen, although he is needed escalating amount of oxygen and is currently on 8 L high flow nasal cannula.  He did not get the vaccine.  8/29: Patient is on 15 L high flow nasal cannula this morning which was increased overnight.  He remained tachypneic off throughout the evening, but is feeling somewhat better this morning.  He continues to have some ongoing diarrhea.  Assessment & Plan:   Principal Problem:   Acute respiratory failure with hypoxia (HCC) Active Problems:   Essential hypertension   Hypertriglyceridemia   Type 2 diabetes mellitus with microalbuminuria, without long-term current use of insulin (HCC)   Pneumonia due to COVID-19 virus   Acute hypoxemic respiratory failure secondary to COVID-19 pneumonia -Continue Solu-Medrol 0.5 mg/kilogram twice daily which has been increased today -Continue baricitinib -Continue vitamin C and zinc -Encourage prone position -Albuterol as needed as well as antitussives -Follow daily inflammatory markers with CRP noted to be increasing slightly today -Continue Lovenox twice daily dosing -CT PE study negative -Plan to [facility] for higher level of  care given increasing oxygen requirement as well as tachypnea -Loperamide as needed for diarrhea  Type 2 diabetes -Continue monitor for hyperglycemia with ongoing steroid use -Continue SSI -Continue Tradjenta as ordered  Hypertension-stable -Continue home amlodipine  DVT prophylaxis: Lovenox 0.5 mg/kg twice daily Code Status: Full code Family Communication: None at bedside, patient states he will call Disposition Plan:  Status is: Inpatient  Remains inpatient appropriate because:IV treatments appropriate due to intensity of illness or inability to take PO and Inpatient level of care appropriate due to severity of illness   Dispo: The patient is from: Home              Anticipated d/c is to: Home              Anticipated d/c date is: 3 days              Patient currently is not medically stable to d/c.   Consultants:   None  Procedures:   See below  Antimicrobials:  Anti-infectives (From admission, onward)   Start     Dose/Rate Route Frequency Ordered Stop   11/07/19 1000  remdesivir 100 mg in sodium chloride 0.9 % 100 mL IVPB       "Followed by" Linked Group Details   100 mg 100 mL/hr over 60 Minutes Intravenous Daily 11/06/19 1638 11/11/19 0959   11/06/19 1800  remdesivir 100 mg in sodium chloride 0.9 % 100 mL IVPB       "Followed by" Linked Group Details   100 mg 100 mL/hr over 60 Minutes Intravenous Once  11/06/19 1638 11/06/19 1940   11/06/19 1700  remdesivir 100 mg in sodium chloride 0.9 % 100 mL IVPB  Status:  Discontinued        100 mg 200 mL/hr over 30 Minutes Intravenous Once 11/06/19 1633 11/06/19 1635   11/06/19 1700  remdesivir 100 mg in sodium chloride 0.9 % 100 mL IVPB       "Followed by" Linked Group Details   100 mg 100 mL/hr over 60 Minutes Intravenous Once 11/06/19 1638 11/06/19 1819       Subjective: Patient seen and evaluated today with some noted tachypnea and ongoing hypoxemia.  He is noted to have some diarrhea as  well.  Objective: Vitals:   11/06/19 2200 11/06/19 2230 11/06/19 2315 11/07/19 0330  BP: 138/85   129/88  Pulse: (!) 116 (!) 117 (!) 108 (!) 122  Resp: (!) 40 (!) 23  (!) 32  Temp:      TempSrc:      SpO2: 95% 93% 95% 91%  Weight:      Height:        Intake/Output Summary (Last 24 hours) at 11/07/2019 0835 Last data filed at 11/07/2019 0404 Gross per 24 hour  Intake 201.4 ml  Output 1500 ml  Net -1298.6 ml   Filed Weights   11/06/19 1309  Weight: 128.4 kg    Examination:  General exam: Appears calm and comfortable  Respiratory system: Clear to auscultation. Respiratory effort normal.  Currently on 15 L high flow nasal cannula oxygen. Cardiovascular system: S1 & S2 heard, RRR.  Gastrointestinal system: Abdomen is nondistended, soft and nontender. Central nervous system: Alert and oriented. No focal neurological deficits. Extremities: Symmetric 5 x 5 power. Skin: No rashes, lesions or ulcers Psychiatry: Judgement and insight appear normal. Mood & affect appropriate.     Data Reviewed: I have personally reviewed following labs and imaging studies  CBC: Recent Labs  Lab 11/05/19 1727 11/06/19 1353 11/07/19 0535  WBC 4.9 5.9 5.1  NEUTROABS 3.7 4.9 4.3  HGB 15.5 15.6 15.2  HCT 47.1 47.3 46.1  MCV 75.1* 75.2* 75.2*  PLT 167 156 171   Basic Metabolic Panel: Recent Labs  Lab 11/05/19 1727 11/06/19 1353 11/07/19 0535  NA 137 131* 135  K 3.4* 3.3* 3.9  CL 103 98 103  CO2 23 22 20*  GLUCOSE 87 93 114*  BUN 12 13 14   CREATININE 1.28* 1.20 0.92  CALCIUM 8.3* 8.2* 8.2*  MG  --   --  2.1  PHOS  --   --  3.3   GFR: Estimated Creatinine Clearance: 169.9 mL/min (by C-G formula based on SCr of 0.92 mg/dL). Liver Function Tests: Recent Labs  Lab 11/06/19 1353 11/07/19 0535  AST 73* 66*  ALT 23 22  ALKPHOS 28* 25*  BILITOT 0.6 0.6  PROT 7.0 6.8  ALBUMIN 3.7 3.4*   No results for input(s): LIPASE, AMYLASE in the last 168 hours. No results for input(s):  AMMONIA in the last 168 hours. Coagulation Profile: No results for input(s): INR, PROTIME in the last 168 hours. Cardiac Enzymes: No results for input(s): CKTOTAL, CKMB, CKMBINDEX, TROPONINI in the last 168 hours. BNP (last 3 results) No results for input(s): PROBNP in the last 8760 hours. HbA1C: No results for input(s): HGBA1C in the last 72 hours. CBG: Recent Labs  Lab 11/05/19 1717 11/07/19 0823  GLUCAP 93 117*   Lipid Profile: Recent Labs    11/06/19 1353  TRIG 228*   Thyroid Function Tests: No results  for input(s): TSH, T4TOTAL, FREET4, T3FREE, THYROIDAB in the last 72 hours. Anemia Panel: Recent Labs    11/06/19 1353  FERRITIN 650*   Sepsis Labs: Recent Labs  Lab 11/06/19 1353 11/06/19 1551  PROCALCITON 0.18  --   LATICACIDVEN 0.9 1.1    Recent Results (from the past 240 hour(s))  SARS Coronavirus 2 by RT PCR (hospital order, performed in [facility] hospital lab) Nasopharyngeal Nasopharyngeal Swab     Status: Abnormal   Collection Time: 11/05/19  4:27 PM   Specimen: Nasopharyngeal Swab  Result Value Ref Range Status   SARS Coronavirus 2 POSITIVE (A) NEGATIVE Final    Comment: RESULT CALLED TO, READ BACK BY AND VERIFIED WITH: J APPELT,RN @1908  11/05/19 MKELLY (NOTE) SARS-CoV-2 target nucleic acids are DETECTED  SARS-CoV-2 RNA is generally detectable in upper respiratory specimens  during the acute phase of infection.  Positive results are indicative  of the presence of the identified virus, but do not rule out bacterial infection or co-infection with other pathogens not detected by the test.  Clinical correlation with patient history and  other diagnostic information is necessary to determine patient infection status.  The expected result is negative.  Fact Sheet for Patients:   11/07/19   Fact Sheet for Healthcare Providers:   BoilerBrush.com.cy    This test is not yet approved or cleared by  the https://pope.com/ FDA and  has been authorized for detection and/or diagnosis of SARS-CoV-2 by FDA under an Emergency Use Authorization (EUA).  This EUA will remain in effect (meaning this tes t can be used) for the duration of  the COVID-19 declaration under Section 564(b)(1) of the Act, 21 U.S.C. section 360-bbb-3(b)(1), unless the authorization is terminated or revoked sooner.  Performed at [facility], [address], [place], [place]   Blood Culture (routine x 2)     Status: None (Preliminary result)   Collection Time: 11/06/19  1:56 PM   Specimen: Blood  Result Value Ref Range Status   Specimen Description BLOOD  Final   Special Requests NONE  Final   Culture   Final    NO GROWTH <12 HOURS Performed at [facility], [address], [place], [place]    Report Status PENDING  Incomplete  Blood Culture (routine x 2)     Status: None (Preliminary result)   Collection Time: 11/06/19  2:06 PM   Specimen: Blood  Result Value Ref Range Status   Specimen Description BLOOD  Final   Special Requests NONE  Final   Culture   Final    NO GROWTH <12 HOURS Performed at [facility], [address], [place], [place]    Report Status PENDING  Incomplete         Radiology Studies: CT Angio Chest PE W and/or Wo Contrast  Result Date: 11/06/2019 CLINICAL DATA:  Shortness of breath, oxygen desaturation, COVID-19 positive EXAM: CT ANGIOGRAPHY CHEST WITH CONTRAST TECHNIQUE: Multidetector CT imaging of the chest was performed using the standard protocol during bolus administration of intravenous contrast. Multiplanar CT image reconstructions and MIPs were obtained to evaluate the vascular anatomy. Initial contrast opacification of the pulmonary arteries was nondiagnostic for assessment for pulmonary embolism and patient was reinjected/repeated. CONTRAST:  Total of 11/08/2019 OMNIPAQUE IOHEXOL 350 MG/ML SOLN IV COMPARISON:  None FINDINGS: Cardiovascular: Aorta normal  caliber without aneurysm or dissection. Heart unremarkable. No pericardial effusion. Pulmonary arteries adequately opacified following reinjection. Pulmonary arteries appear patent. No evidence of pulmonary embolism. Mediastinum/Nodes:  Base of cervical region normal appearance. Esophagus unremarkable. No thoracic adenopathy. Lungs/Pleura: Patchy BILATERAL airspace infiltrates throughout all lobes consistent with multifocal pneumonia and history of COVID-19. No pleural effusion or pneumothorax. Upper Abdomen: Unremarkable Musculoskeletal: No acute osseous findings. Review of the MIP images confirms the above findings. IMPRESSION: No evidence of pulmonary embolism. Patchy BILATERAL airspace infiltrates throughout all lobes consistent with multifocal pneumonia and COVID-19. Electronically Signed   By: Ulyses SouthwardMark  Boles M.D.   On: 11/06/2019 15:29   DG Chest Port 1 View  Result Date: 11/05/2019 CLINICAL DATA:  Shortness of breath, possible COVID EXAM: PORTABLE CHEST 1 VIEW COMPARISON:  07/29/2019 FINDINGS: Patchy bilateral nodular airspace disease noted. Low lung volumes. Heart is normal size. No effusions or pneumothorax. No acute bony abnormality. IMPRESSION: Patchy bilateral nodular airspace disease most compatible with COVID pneumonia. Electronically Signed   By: Charlett NoseKevin  Dover M.D.   On: 11/05/2019 17:25        Scheduled Meds: . albuterol  10 puff Inhalation Q4H  . amLODipine  10 mg Oral Daily  . vitamin C  500 mg Oral Daily  . baricitinib  4 mg Oral Daily  . enoxaparin (LOVENOX) injection  0.5 mg/kg Subcutaneous Q24H  . insulin aspart  0-15 Units Subcutaneous TID WC  . insulin aspart  0-5 Units Subcutaneous QHS  . insulin glargine  40 Units Subcutaneous Daily  . linagliptin  5 mg Oral Daily  . methylPREDNISolone (SOLU-MEDROL) injection  0.5 mg/kg Intravenous Q12H  . zinc sulfate  220 mg Oral Daily   Continuous Infusions: . remdesivir 100 mg in NS 100 mL       LOS: 1 day    Time spent: 35  minutes    Ruie Sendejo Hoover Brunette Jazmaine Fuelling, DO [facility]  If 7PM-7AM, please contact night-coverage [email or web address] 11/07/2019, 8:35 AM

## 2019-11-07 NOTE — Progress Notes (Signed)
Lower extremity venous bilateral study completed.   Please see CV Proc for preliminary results.   Jaspreet Hollings  

## 2019-11-07 NOTE — Progress Notes (Signed)
   11/07/19 1215  Assess: MEWS Score  Temp 97.8 F (36.6 C)  BP (!) 150/101  Pulse Rate (!) 119  ECG Heart Rate (!) 119  Resp (!) 28  SpO2 (!) 89 %  O2 Device HFNC  O2 Flow Rate (L/min) 20 L/min  FiO2 (%) 100 %  Assess: MEWS Score  MEWS Temp 0  MEWS Systolic 0  MEWS Pulse 2  MEWS RR 2  MEWS LOC 0  MEWS Score 4  MEWS Score Color Red  Notified RT that patient needed heated high flow was informed she would respond to order after current code in process. Patient encouraged to use IS and oob in chair with hands on knees to improve oxygenation.

## 2019-11-07 NOTE — Progress Notes (Signed)
   11/07/19 1100  Assess: MEWS Score  Temp 99.1 F (37.3 C)  BP (!) 133/94  Pulse Rate (!) 116  ECG Heart Rate (!) 116  Resp (!) 29  SpO2 (!) 89 %  Assess: MEWS Score  MEWS Temp 0  MEWS Systolic 0  MEWS Pulse 2  MEWS RR 2  MEWS LOC 0  MEWS Score 4  MEWS Score Color Red  Sustaining tachycardia and tachypnea notified md that therapeutic levels have not been achieved, new orders for changes in respiratory intervention and additional cardiac medication

## 2019-11-08 LAB — C-REACTIVE PROTEIN: CRP: 7.1 mg/dL — ABNORMAL HIGH (ref <1.0)

## 2019-11-08 LAB — COMPREHENSIVE METABOLIC PANEL
ALT: 21 U/L (ref 0–44)
AST: 44 U/L — ABNORMAL HIGH (ref 15–41)
Albumin: 3.1 g/dL — ABNORMAL LOW (ref 3.5–5.0)
Alkaline Phosphatase: 24 U/L — ABNORMAL LOW (ref 38–126)
Anion gap: 11 (ref 5–15)
BUN: 16 mg/dL (ref 6–20)
CO2: 24 mmol/L (ref 22–32)
Calcium: 8.8 mg/dL — ABNORMAL LOW (ref 8.9–10.3)
Chloride: 102 mmol/L (ref 98–111)
Creatinine, Ser: 0.9 mg/dL (ref 0.61–1.24)
GFR calc Af Amer: >60 mL/min (ref >60)
GFR calc non Af Amer: >60 mL/min (ref >60)
Glucose, Bld: 149 mg/dL — ABNORMAL HIGH (ref 70–99)
Potassium: 4.4 mmol/L (ref 3.5–5.1)
Sodium: 137 mmol/L (ref 135–145)
Total Bilirubin: 0.5 mg/dL (ref 0.3–1.2)
Total Protein: 6.8 g/dL (ref 6.5–8.1)

## 2019-11-08 LAB — CBC WITH DIFFERENTIAL/PLATELET
Abs Immature Granulocytes: 0.03 10*3/uL (ref 0.00–0.07)
Basophils Absolute: 0 10*3/uL (ref 0.0–0.1)
Basophils Relative: 0 %
Eosinophils Absolute: 0 10*3/uL (ref 0.0–0.5)
Eosinophils Relative: 0 %
HCT: 45.6 % (ref 39.0–52.0)
Hemoglobin: 15 g/dL (ref 13.0–17.0)
Immature Granulocytes: 1 %
Lymphocytes Relative: 10 %
Lymphs Abs: 0.6 10*3/uL — ABNORMAL LOW (ref 0.7–4.0)
MCH: 24.4 pg — ABNORMAL LOW (ref 26.0–34.0)
MCHC: 32.9 g/dL (ref 30.0–36.0)
MCV: 74 fL — ABNORMAL LOW (ref 80.0–100.0)
Monocytes Absolute: 0.3 10*3/uL (ref 0.1–1.0)
Monocytes Relative: 4 %
Neutro Abs: 5.7 10*3/uL (ref 1.7–7.7)
Neutrophils Relative %: 85 %
Platelets: 228 10*3/uL (ref 150–400)
RBC: 6.16 MIL/uL — ABNORMAL HIGH (ref 4.22–5.81)
RDW: 14 % (ref 11.5–15.5)
WBC: 6.6 10*3/uL (ref 4.0–10.5)
nRBC: 0 % (ref 0.0–0.2)

## 2019-11-08 LAB — GLUCOSE, CAPILLARY
Glucose-Capillary: 183 mg/dL — ABNORMAL HIGH (ref 70–99)
Glucose-Capillary: 161 mg/dL — ABNORMAL HIGH (ref 70–99)
Glucose-Capillary: 162 mg/dL — ABNORMAL HIGH (ref 70–99)
Glucose-Capillary: 171 mg/dL — ABNORMAL HIGH (ref 70–99)

## 2019-11-08 LAB — D-DIMER, QUANTITATIVE: D-Dimer, Quant: 2.94 ug/mL-FEU — ABNORMAL HIGH (ref 0.00–0.50)

## 2019-11-08 LAB — BRAIN NATRIURETIC PEPTIDE: B Natriuretic Peptide: 17.5 pg/mL (ref 0.0–100.0)

## 2019-11-08 LAB — MAGNESIUM: Magnesium: 2.2 mg/dL (ref 1.7–2.4)

## 2019-11-08 MED ORDER — ENOXAPARIN SODIUM 120 MG/0.8ML ~~LOC~~ SOLN
120.0000 mg | Freq: Two times a day (BID) | SUBCUTANEOUS | Status: DC
  Administered 2019-11-08 – 2019-11-12 (×8): 120 mg via SUBCUTANEOUS
  Filled 2019-11-08 (×9): qty 0.8

## 2019-11-08 NOTE — Evaluation (Signed)
Occupational Therapy Evaluation Patient Details Name: Kevin Sanders MRN: [id number] DOB: 04/22/2000 Today's Date: 11/08/2019    History of Present Illness Pt is a 19 y/o male with PMHx including DM, HTN. Pt diagnosed with Covid, initially presented to [facility] and requiring 8LO2, however continued to have worsening respiratory status and was transferred to [facility]. Admitted with acute hypoxic respiratory failure d/t Covid 19 viral pneumonitis.    Clinical Impression   This 19 y/o male presents with the above. PTA pt independent with ADL, iADL and functional mobility. Today pt performing mobility and ADL tasks grossly at supervision level. Pt with increased DOE with minimal activity (limited distance in room given length of O2 lines) and requiring seated rest breaks. Educated in general activity progression as well as deep breathing techniques when feeling dyspneic. Pt on 20L HHFNC with lowest SpO2 noted 87% with mobility. SpO2 88-91% end of session seated in recliner. Pt with good motivation to work with therapies to progress towards his PLOF. He will benefit from continued acute OT services to maximize his strength, safety and independence with ADL and mobility. Do not anticipate he will require follow up OT services after discharge.     Follow Up Recommendations  No OT follow up;Supervision - Intermittent    Equipment Recommendations  None recommended by OT           Precautions / Restrictions Precautions Precautions: Fall Precaution Comments: watch O2 Restrictions Weight Bearing Restrictions: No      Mobility Bed Mobility Overal bed mobility: Modified Independent                Transfers Overall transfer level: Needs assistance Equipment used: None Transfers: Sit to/from Stand;Stand Pivot Transfers Sit to Stand: Supervision Stand pivot transfers: Supervision       General transfer comment: for line management     Balance Overall balance assessment: No apparent  balance deficits (not formally assessed)                                         ADL either performed or assessed with clinical judgement   ADL Overall ADL's : Needs assistance/impaired Eating/Feeding: Modified independent;Sitting   Grooming: Set up;Sitting   Upper Body Bathing: Set up;Sitting   Lower Body Bathing: Supervison/ safety;Sitting/lateral leans;Sit to/from stand   Upper Body Dressing : Set up;Sitting   Lower Body Dressing: Supervision/safety;Sitting/lateral leans;Sit to/from stand   Toilet Transfer: Supervision/safety;Stand-pivot   Toileting- Architect and Hygiene: Supervision/safety;Sitting/lateral lean;Sit to/from stand       Functional mobility during ADLs: Supervision/safety General ADL Comments: pt limited partly due to lines, aslo due to worsening DOE with activity                          Pertinent Vitals/Pain Pain Assessment: No/denies pain     Hand Dominance     Extremity/Trunk Assessment Upper Extremity Assessment Upper Extremity Assessment: Overall WFL for tasks assessed   Lower Extremity Assessment Lower Extremity Assessment: Defer to PT evaluation;Overall WFL for tasks assessed       Communication Communication Communication: No difficulties   Cognition Arousal/Alertness: Awake/alert Behavior During Therapy: WFL for tasks assessed/performed Overall Cognitive Status: Within Functional Limits for tasks assessed  General Comments       Exercises Exercises: Other exercises Other Exercises Other Exercises: issued level 2 theraband and HEP - verbally reviewed and demonstrated Other Exercises: encouraged continued IS use - pt with some remaining dyspnea end of session and unable to perform    Shoulder Instructions      Home Living Family/patient expects to be discharged to:: Private residence Living Arrangements: Other relatives  (grandparents) Available Help at Discharge: Family Type of Home: House Home Access: Stairs to enter Entrance Stairs-Number of Steps: 4   Home Layout: Two level;Able to live on main level with bedroom/bathroom (pt's bedroom on 1st floor)     Bathroom Shower/Tub: Producer, television/film/video: Standard     Home Equipment: None          Prior Functioning/Environment Level of Independence: Independent        Comments: works at [employer]        OT Problem List: Cardiopulmonary status limiting activity;Decreased activity tolerance;Obesity      OT Treatment/Interventions: Self-care/ADL training;Therapeutic exercise;Energy conservation;DME and/or AE instruction;Therapeutic activities;Patient/family education;Balance training    OT Goals(Current goals can be found in the care plan section) Acute Rehab OT Goals Patient Stated Goal: return to his PLOF OT Goal Formulation: With patient Time For Goal Achievement: 11/22/19 Potential to Achieve Goals: Good  OT Frequency: Min 2X/week   Barriers to D/C:            Co-evaluation              AM-PAC OT "6 Clicks" Daily Activity     Outcome Measure Help from another person eating meals?: None Help from another person taking care of personal grooming?: A Little Help from another person toileting, which includes using toliet, bedpan, or urinal?: A Little Help from another person bathing (including washing, rinsing, drying)?: A Little Help from another person to put on and taking off regular upper body clothing?: None Help from another person to put on and taking off regular lower body clothing?: A Little 6 Click Score: 20   End of Session Equipment Utilized During Treatment: Oxygen Nurse Communication: Mobility status  Activity Tolerance: Patient tolerated treatment well Patient left: in chair;with call bell/phone within reach  OT Visit Diagnosis: Other abnormalities of gait and mobility (R26.89);Other  (comment) (decreased cardiorespiratory status/activity tolerance )                Time: 1050-1120 OT Time Calculation (min): 30 min Charges:  OT General Charges $OT Visit: 1 Visit OT Evaluation $OT Eval Moderate Complexity: 1 Mod OT Treatments $Therapeutic Activity: 8-22 mins  Marcy Siren, OT Acute Rehabilitation Services Pager [phone] Office [phone]    Orlando Penner 11/08/2019, 1:17 PM

## 2019-11-08 NOTE — Progress Notes (Signed)
PROGRESS NOTE                                                                                                                                                                                                             Patient Demographics:    Kevin Sanders, is a 19 y.o. male, DOB - [birth year], OIN:[id number]  Outpatient Primary MD for the patient is Patient, No Pcp Per    LOS - 2  Admit date - 11/06/2019    Chief Complaint  Patient presents with  . Shortness of Breath covid pos       Brief Narrative - Kevin Sanders is a 19 y.o. male with a history of diabetes, hypertension.  Patient was seen in the ED yesterday and was diagnosed with COVID.  Started feeling ill 6 days ago, he is not vaccinated for Covid and was exposed to sick family members.  When he came to [facility], ER he was placed on 8 L oxygen, despite aggressive treatment he continued to get worse and was transferred to [facility] on 15 L of oxygen.   Subjective:   Patient in bed, appears comfortable, denies any headache, no fever, no chest pain or pressure, improved shortness of breath , no abdominal pain. No focal weakness.    Assessment  & Plan :     1. Acute Hypoxic Resp. Failure due to Acute Covid 19 Viral Pneumonitis during the ongoing 2020 Covid 19 Pandemic - he is unfortunately not vaccinated and has incurred severe parenchymal lung injury due to COVID-19 pneumonia.  He has been placed on high-dose IV steroids, remdesivir and persistent.  He is overall quite tenuous.  If any further decline we will put him on heated high flow.  Encouraged the patient to sit up in chair in the daytime use I-S and flutter valve for pulmonary toiletry and then prone in bed when at night.  Will advance activity and titrate down oxygen as possible.  SpO2: 94 % O2 Flow Rate (L/min): 20 L/min FiO2 (%): 100 %  Recent Labs  Lab 11/05/19 1627 11/05/19 1727  11/06/19 1353 11/06/19 1551 11/07/19 0535 11/07/19 0536 11/08/19 0233  WBC  --  4.9 5.9  --  5.1  --  6.6  PLT  --  167 156  --  171  --  228  CRP  --   --  12.6*  --   --  14.7* 7.1*  AST  --   --  73*  --  66*  --  44*  ALT  --   --  23  --  22  --  21  ALKPHOS  --   --  28*  --  25*  --  24*  BILITOT  --   --  0.6  --  0.6  --  0.5  ALBUMIN  --   --  3.7  --  3.4*  --  3.1*  DDIMER  --   --  2.30*  --  2.47*  --  2.94*  PROCALCITON  --   --  0.18  --   --   --   --   LATICACIDVEN  --   --  0.9 1.1  --   --   --   SARSCOV2NAA POSITIVE*  --   --   --   --   --   --     2.  Morbid obesity.  BMI of 41.  Follow with PCP for weight loss.  3.  Essential hypertension.  Currently on Norvasc.  4.  Moderately elevated D-dimer due to intense inflammation.  Dimer is rising despite being on high dose of Lovenox will switch him to full dose as his risk of developing a blood clot is extremely high due to intense inflammation and morbid obesity.   5.  DM type II.  Continue combination of Lantus, sliding scale, and linagliptin, added premeal NovoLog for better control as well.  Lab Results  Component Value Date   HGBA1C 6.1 (A) 10/25/2019   CBG (last 3)  Recent Labs    11/07/19 2137 11/08/19 0738 11/08/19 1152  GLUCAP 147* 183* 161*      Condition - Extremely Guarded  Family Communication  : Mother Elmarie Shiley [phone] on 11/07/2019  Code Status :  Full  Consults  :  None  Procedures  :  None  PUD Prophylaxis : None  Disposition Plan  :    Status is: Inpatient  Remains inpatient appropriate because:IV treatments appropriate due to intensity of illness or inability to take PO   Dispo: The patient is from: Home              Anticipated d/c is to: Home              Anticipated d/c date is: > 3 days              Patient currently is not medically stable to d/c.   DVT Prophylaxis  :  Lovenox   Lab Results  Component Value Date   PLT 228 11/08/2019    Diet :   Diet Order            Diet Carb Modified Fluid consistency: Thin; Room service appropriate? Yes  Diet effective now                  Inpatient Medications  Scheduled Meds: . albuterol  10 puff Inhalation Q4H  . vitamin C  500 mg Oral Daily  . baricitinib  4 mg Oral Daily  . enoxaparin (LOVENOX) injection  0.5 mg/kg Subcutaneous Q12H  . insulin aspart  0-15 Units Subcutaneous TID WC  . insulin aspart  0-5 Units Subcutaneous QHS  . insulin aspart  4 Units Subcutaneous TID WC  . insulin glargine  40 Units Subcutaneous Daily  . linagliptin  5 mg Oral Daily  . methylPREDNISolone (  SOLU-MEDROL) injection  80 mg Intravenous Q12H  . metoprolol tartrate  25 mg Oral BID  . pneumococcal 23 valent vaccine  0.5 mL Intramuscular Tomorrow-1000  . zinc sulfate  220 mg Oral Daily   Continuous Infusions: . remdesivir 100 mg in NS 100 mL 100 mg (11/08/19 0930)   PRN Meds:.bisacodyl, chlorpheniramine-HYDROcodone, guaiFENesin-dextromethorphan, loperamide, metoprolol tartrate, [DISCONTINUED] ondansetron **OR** ondansetron (ZOFRAN) IV  Antibiotics  :    Anti-infectives (From admission, onward)   Start     Dose/Rate Route Frequency Ordered Stop   11/07/19 1000  remdesivir 100 mg in sodium chloride 0.9 % 100 mL IVPB       "Followed by" Linked Group Details   100 mg 100 mL/hr over 60 Minutes Intravenous Daily 11/06/19 1638 11/11/19 0959   11/06/19 1800  remdesivir 100 mg in sodium chloride 0.9 % 100 mL IVPB       "Followed by" Linked Group Details   100 mg 100 mL/hr over 60 Minutes Intravenous Once 11/06/19 1638 11/06/19 1940   11/06/19 1700  remdesivir 100 mg in sodium chloride 0.9 % 100 mL IVPB  Status:  Discontinued        100 mg 200 mL/hr over 30 Minutes Intravenous Once 11/06/19 1633 11/06/19 1635   11/06/19 1700  remdesivir 100 mg in sodium chloride 0.9 % 100 mL IVPB       "Followed by" Linked Group Details   100 mg 100 mL/hr over 60 Minutes Intravenous Once 11/06/19 1638 11/06/19 1819        Time Spent in minutes  30   Susa RaringPrashant Rakin Lemelle M.D on 11/08/2019 at 1:50 PM  To page go to [email or web address] - password [email or web address]  [facility] -  Office  [phone]   See all Orders from today for further details    Objective:   Vitals:   11/08/19 0428 11/08/19 0626 11/08/19 0700 11/08/19 1201  BP: (!) 148/93  (!) 132/96 (!) 135/96  Pulse: 89  97 92  Resp: 19  20 18   Temp: 98.7 F (37.1 C)  97.8 F (36.6 C) 98.1 F (36.7 C)  TempSrc: Oral  Oral Oral  SpO2: 92%  90% 94%  Weight:  118.8 kg    Height:        Wt Readings from Last 3 Encounters:  11/08/19 118.8 kg  11/05/19 128.4 kg  11/05/19 128 kg     Intake/Output Summary (Last 24 hours) at 11/08/2019 1350 Last data filed at 11/08/2019 0700 Gross per 24 hour  Intake 480 ml  Output 1800 ml  Net -1320 ml     Physical Exam  Awake Alert, No new F.N deficits, Normal affect Stem.AT,PERRAL Supple Neck,No JVD, No cervical lymphadenopathy appriciated.  Symmetrical Chest wall movement, Good air movement bilaterally, CTAB RRR,No Gallops, Rubs or new Murmurs, No Parasternal Heave +ve B.Sounds, Abd Soft, No tenderness, No organomegaly appriciated, No rebound - guarding or rigidity. No Cyanosis, Clubbing or edema, No new Rash or bruise     Data Review:    CBC Recent Labs  Lab 11/05/19 1727 11/06/19 1353 11/07/19 0535 11/08/19 0233  WBC 4.9 5.9 5.1 6.6  HGB 15.5 15.6 15.2 15.0  HCT 47.1 47.3 46.1 45.6  PLT 167 156 171 228  MCV 75.1* 75.2* 75.2* 74.0*  MCH 24.7* 24.8* 24.8* 24.4*  MCHC 32.9 33.0 33.0 32.9  RDW 13.8 13.7 13.8 14.0  LYMPHSABS 0.8 0.8 0.6* 0.6*  MONOABS 0.3 0.3 0.1 0.3  EOSABS 0.0 0.0 0.0 0.0  BASOSABS 0.0 0.0 0.0 0.0  Recent Labs  Lab 11/05/19 1727 11/06/19 1353 11/06/19 1551 11/07/19 0535 11/07/19 0536 11/08/19 0233  NA 137 131*  --  135  --  137  K 3.4* 3.3*  --  3.9  --  4.4  CL 103 98  --  103  --  102  CO2 23 22  --  20*  --  24  GLUCOSE 87 93  --  114*  --  149*  BUN  12 13  --  14  --  16  CREATININE 1.28* 1.20  --  0.92  --  0.90  CALCIUM 8.3* 8.2*  --  8.2*  --  8.8*  AST  --  73*  --  66*  --  44*  ALT  --  23  --  22  --  21  ALKPHOS  --  28*  --  25*  --  24*  BILITOT  --  0.6  --  0.6  --  0.5  ALBUMIN  --  3.7  --  3.4*  --  3.1*  MG  --   --   --  2.1  --  2.2  CRP  --  12.6*  --   --  14.7* 7.1*  DDIMER  --  2.30*  --  2.47*  --  2.94*  PROCALCITON  --  0.18  --   --   --   --   LATICACIDVEN  --  0.9 1.1  --   --   --   BNP  --   --   --   --   --  17.5    ------------------------------------------------------------------------------------------------------------------ Recent Labs    11/06/19 1353  TRIG 228*    Lab Results  Component Value Date   HGBA1C 6.1 (A) 10/25/2019   ------------------------------------------------------------------------------------------------------------------ No results for input(s): TSH, T4TOTAL, T3FREE, THYROIDAB in the last 72 hours.  Invalid input(s): FREET3  Cardiac Enzymes No results for input(s): CKMB, TROPONINI, MYOGLOBIN in the last 168 hours.  Invalid input(s): CK ------------------------------------------------------------------------------------------------------------------    Component Value Date/Time   BNP 17.5 11/08/2019 0233    Micro Results Recent Results (from the past 240 hour(s))  SARS Coronavirus 2 by RT PCR (hospital order, performed in [facility] hospital lab) Nasopharyngeal Nasopharyngeal Swab     Status: Abnormal   Collection Time: 11/05/19  4:27 PM   Specimen: Nasopharyngeal Swab  Result Value Ref Range Status   SARS Coronavirus 2 POSITIVE (A) NEGATIVE Final    Comment: RESULT CALLED TO, READ BACK BY AND VERIFIED WITH: J APPELT,RN  11/05/19 MKELLY (NOTE) SARS-CoV-2 target nucleic acids are DETECTED  SARS-CoV-2 RNA is generally detectable in upper respiratory specimens  during the acute phase of infection.  Positive results are indicative  of the presence  of the identified virus, but do not rule out bacterial infection or co-infection with other pathogens not detected by the test.  Clinical correlation with patient history and  other diagnostic information is necessary to determine patient infection status.  The expected result is negative.  Fact Sheet for Patients:   BoilerBrush.com.cy   Fact Sheet for Healthcare Providers:   https://pope.com/    This test is not yet approved or cleared by the [place] FDA and  has been authorized for detection and/or diagnosis of SARS-CoV-2 by FDA under an Emergency Use Authorization (EUA).  This EUA will remain in effect (meaning this tes t can be used) for the duration of  the COVID-19 declaration under Section 564(b)(1) of  the Act, 21 U.S.C. section 360-bbb-3(b)(1), unless the authorization is terminated or revoked sooner.  Performed at [facility], [address], [place], [place] [address]   Blood Culture (routine x 2)     Status: None (Preliminary result)   Collection Time: 11/06/19  1:56 PM   Specimen: BLOOD  Result Value Ref Range Status   Specimen Description BLOOD  Final   Special Requests NONE  Final   Culture   Final    NO GROWTH 2 DAYS Performed at [facility], [address], [place], [place] [address]    Report Status PENDING  Incomplete  Blood Culture (routine x 2)     Status: None (Preliminary result)   Collection Time: 11/06/19  2:06 PM   Specimen: BLOOD  Result Value Ref Range Status   Specimen Description BLOOD  Final   Special Requests NONE  Final   Culture   Final    NO GROWTH 2 DAYS Performed at [facility], [address], [place], [place] [address]    Report Status PENDING  Incomplete    Radiology Reports CT Angio Chest PE W and/or Wo Contrast  Result Date: 11/06/2019 CLINICAL DATA:  Shortness of breath, oxygen desaturation, COVID-19 positive EXAM: CT ANGIOGRAPHY CHEST WITH CONTRAST TECHNIQUE: Multidetector  CT imaging of the chest was performed using the standard protocol during bolus administration of intravenous contrast. Multiplanar CT image reconstructions and MIPs were obtained to evaluate the vascular anatomy. Initial contrast opacification of the pulmonary arteries was nondiagnostic for assessment for pulmonary embolism and patient was reinjected/repeated. CONTRAST:  Total of OMNIPAQUE IOHEXOL 350 MG/ML SOLN IV COMPARISON:  None FINDINGS: Cardiovascular: Aorta normal caliber without aneurysm or dissection. Heart unremarkable. No pericardial effusion. Pulmonary arteries adequately opacified following reinjection. Pulmonary arteries appear patent. No evidence of pulmonary embolism. Mediastinum/Nodes: Base of cervical region normal appearance. Esophagus unremarkable. No thoracic adenopathy. Lungs/Pleura: Patchy BILATERAL airspace infiltrates throughout all lobes consistent with multifocal pneumonia and history of COVID-19. No pleural effusion or pneumothorax. Upper Abdomen: Unremarkable Musculoskeletal: No acute osseous findings. Review of the MIP images confirms the above findings. IMPRESSION: No evidence of pulmonary embolism. Patchy BILATERAL airspace infiltrates throughout all lobes consistent with multifocal pneumonia and COVID-19. Electronically Signed   By: Ulyses Southward M.D.   On: 11/06/2019 15:29   DG Chest Port 1 View  Result Date: 11/05/2019 CLINICAL DATA:  Shortness of breath, possible COVID EXAM: PORTABLE CHEST 1 VIEW COMPARISON:  07/29/2019 FINDINGS: Patchy bilateral nodular airspace disease noted. Low lung volumes. Heart is normal size. No effusions or pneumothorax. No acute bony abnormality. IMPRESSION: Patchy bilateral nodular airspace disease most compatible with COVID pneumonia. Electronically Signed   By: Charlett Nose M.D.   On: 11/05/2019 17:25   VAS [place] LOWER EXTREMITY VENOUS (DVT)  Result Date: 11/07/2019  Lower Venous DVTStudy Indications: Elevated d dimer.  Comparison Study: No  prior studies. Performing Technologist: Jean Rosenthal  Examination Guidelines: A complete evaluation includes B-mode imaging, spectral Doppler, color Doppler, and power Doppler as needed of all accessible portions of each vessel. Bilateral testing is considered an integral part of a complete examination. Limited examinations for reoccurring indications may be performed as noted. The reflux portion of the exam is performed with the patient in reverse Trendelenburg.  +---------+---------------+---------+-----------+----------+--------------+ RIGHT    CompressibilityPhasicitySpontaneityPropertiesThrombus Aging +---------+---------------+---------+-----------+----------+--------------+ CFV      Full           Yes      Yes                                 +---------+---------------+---------+-----------+----------+--------------+  SFJ      Full                                                        +---------+---------------+---------+-----------+----------+--------------+ FV Prox  Full                                                        +---------+---------------+---------+-----------+----------+--------------+ FV Mid   Full                                                        +---------+---------------+---------+-----------+----------+--------------+ FV DistalFull                                                        +---------+---------------+---------+-----------+----------+--------------+ PFV      Full                                                        +---------+---------------+---------+-----------+----------+--------------+ POP      Full           Yes      Yes                                 +---------+---------------+---------+-----------+----------+--------------+ PTV      Full                                                        +---------+---------------+---------+-----------+----------+--------------+ PERO     Full                                                         +---------+---------------+---------+-----------+----------+--------------+   +---------+---------------+---------+-----------+----------+--------------+ LEFT     CompressibilityPhasicitySpontaneityPropertiesThrombus Aging +---------+---------------+---------+-----------+----------+--------------+ CFV      Full           Yes      Yes                                 +---------+---------------+---------+-----------+----------+--------------+ SFJ      Full                                                        +---------+---------------+---------+-----------+----------+--------------+  FV Prox  Full                                                        +---------+---------------+---------+-----------+----------+--------------+ FV Mid   Full                                                        +---------+---------------+---------+-----------+----------+--------------+ FV DistalFull                                                        +---------+---------------+---------+-----------+----------+--------------+ PFV      Full                                                        +---------+---------------+---------+-----------+----------+--------------+ POP      Full           Yes      Yes                                 +---------+---------------+---------+-----------+----------+--------------+ PTV      Full                                                        +---------+---------------+---------+-----------+----------+--------------+ PERO     Full                                                        +---------+---------------+---------+-----------+----------+--------------+     Summary: RIGHT: - There is no evidence of deep vein thrombosis in the lower extremity.  - No cystic structure found in the popliteal fossa.  LEFT: - There is no evidence of deep vein thrombosis in the lower extremity.  - No cystic  structure found in the popliteal fossa.  *See table(s) above for measurements and observations. Electronically signed by Lemar Livings MD on 11/07/2019 at 7:41:34 PM.    Final

## 2019-11-08 NOTE — Evaluation (Signed)
Physical Therapy Evaluation Patient Details Name: Kevin Sanders MRN: [id number] DOB: 2000-09-30 Today's Date: 11/08/2019   History of Present Illness  Pt is a 19 y/o male with PMHx including DM, HTN. Pt diagnosed with Covid, initially presented to [facility] and requiring 8LO2, however continued to have worsening respiratory status and was transferred to [facility]. Admitted with acute hypoxic respiratory failure d/t Covid 19 viral pneumonitis.   Clinical Impression  Pt presents to PT with decr functional activity tolerance due to decr pulmonary function. Expect pt will progress well and will not need PT at time of DC.     Follow Up Recommendations No PT follow up    Equipment Recommendations  None recommended by PT    Recommendations for Other Services       Precautions / Restrictions Precautions Precautions: Other (comment) Precaution Comments: watch O2      Mobility  Bed Mobility               General bed mobility comments: Pt up in chair  Transfers Overall transfer level: Needs assistance Equipment used: None Transfers: Sit to/from Stand;Stand Pivot Transfers Sit to Stand: Supervision         General transfer comment: supervision with line management  Ambulation/Gait Ambulation/Gait assistance: Supervision Gait Distance (Feet): 100 Feet (100' x 1, 80' x 1, 70' x 1, 50' x 1, 30' x 1) Assistive device: None Gait Pattern/deviations: Step-through pattern;Decreased stride length Gait velocity: decr Gait velocity interpretation: 1.31 - 2.62 ft/sec, indicative of limited community ambulator (slowed due to lines/tubes) General Gait Details: Pt amb back and forth 5' repeatedly due to on heated HFNC with limited line length.   Stairs            Wheelchair Mobility    Modified Rankin (Stroke Patients Only)       Balance Overall balance assessment: No apparent balance deficits (not formally assessed)                                            Pertinent Vitals/Pain Pain Assessment: No/denies pain    Home Living Family/patient expects to be discharged to:: Private residence Living Arrangements: Other relatives (grandparents) Available Help at Discharge: Family Type of Home: House Home Access: Stairs to enter   Secretary/administrator of Steps: 4 Home Layout: Two level;Able to live on main level with bedroom/bathroom (pt's bedroom on 1st floor) Home Equipment: None      Prior Function Level of Independence: Independent         Comments: works at [employer]        Extremity/Trunk Assessment   Upper Extremity Assessment Upper Extremity Assessment: Defer to OT evaluation    Lower Extremity Assessment Lower Extremity Assessment: Overall WFL for tasks assessed       Communication   Communication: No difficulties  Cognition Arousal/Alertness: Awake/alert Behavior During Therapy: WFL for tasks assessed/performed Overall Cognitive Status: Within Functional Limits for tasks assessed                                        General Comments General comments (skin integrity, edema, etc.): Pt on 20L HHFNC with SpO2 decr to 85-86% with activity. Returns to 90% after 2 minutes seated rest break  Exercises Other Exercises Other Exercises: Instructed to perform minisquats in standing 5-10 reps throughout the day.   Assessment/Plan    PT Assessment Patient needs continued PT services  PT Problem List Decreased activity tolerance;Decreased mobility;Cardiopulmonary status limiting activity       PT Treatment Interventions Gait training;Functional mobility training;Therapeutic activities;Therapeutic exercise;Patient/family education    PT Goals (Current goals can be found in the Care Plan section)  Acute Rehab PT Goals Patient Stated Goal: return to his PLOF PT Goal Formulation: With patient Time For Goal Achievement: 11/15/19 Potential to Achieve Goals:  Good    Frequency Min 3X/week   Barriers to discharge        Co-evaluation               AM-PAC PT "6 Clicks" Mobility  Outcome Measure Help needed turning from your back to your side while in a flat bed without using bedrails?: None Help needed moving from lying on your back to sitting on the side of a flat bed without using bedrails?: None Help needed moving to and from a bed to a chair (including a wheelchair)?: None Help needed standing up from a chair using your arms (e.g., wheelchair or bedside chair)?: None Help needed to walk in hospital room?: None Help needed climbing 3-5 steps with a railing? : A Little 6 Click Score: 23    End of Session Equipment Utilized During Treatment: Oxygen Activity Tolerance: Patient tolerated treatment well Patient left: in chair;with call bell/phone within reach   PT Visit Diagnosis: Other abnormalities of gait and mobility (R26.89)    Time: 7425-9563 PT Time Calculation (min) (ACUTE ONLY): 18 min   Charges:   PT Evaluation $PT Eval Moderate Complexity: 1 Mod          [facility] PT Acute Rehabilitation Services Pager [phone] Office [phone]   Angelina Ok [facility] 11/08/2019, 4:20 PM

## 2019-11-08 NOTE — Plan of Care (Signed)
  Problem: Education: Goal: Knowledge of risk factors and measures for prevention of condition will improve Outcome: Progressing   Problem: Coping: Goal: Psychosocial and spiritual needs will be supported Outcome: Progressing   Problem: Respiratory: Goal: Will maintain a patent airway Outcome: Progressing Goal: Complications related to the disease process, condition or treatment will be avoided or minimized Outcome: Progressing   

## 2019-11-09 ENCOUNTER — Inpatient Hospital Stay (HOSPITAL_COMMUNITY): Payer: 59

## 2019-11-09 ENCOUNTER — Encounter (HOSPITAL_COMMUNITY): Payer: 59

## 2019-11-09 DIAGNOSIS — U071 COVID-19: Secondary | ICD-10-CM

## 2019-11-09 DIAGNOSIS — R7989 Other specified abnormal findings of blood chemistry: Secondary | ICD-10-CM

## 2019-11-09 LAB — GLUCOSE, CAPILLARY
Glucose-Capillary: 163 mg/dL — ABNORMAL HIGH (ref 70–99)
Glucose-Capillary: 162 mg/dL — ABNORMAL HIGH (ref 70–99)
Glucose-Capillary: 138 mg/dL — ABNORMAL HIGH (ref 70–99)
Glucose-Capillary: 171 mg/dL — ABNORMAL HIGH (ref 70–99)

## 2019-11-09 LAB — CBC WITH DIFFERENTIAL/PLATELET
Abs Immature Granulocytes: 0.06 10*3/uL (ref 0.00–0.07)
Basophils Absolute: 0 10*3/uL (ref 0.0–0.1)
Basophils Relative: 0 %
Eosinophils Absolute: 0 10*3/uL (ref 0.0–0.5)
Eosinophils Relative: 0 %
HCT: 44.4 % (ref 39.0–52.0)
Hemoglobin: 14.2 g/dL (ref 13.0–17.0)
Immature Granulocytes: 1 %
Lymphocytes Relative: 4 %
Lymphs Abs: 0.4 10*3/uL — ABNORMAL LOW (ref 0.7–4.0)
MCH: 23.8 pg — ABNORMAL LOW (ref 26.0–34.0)
MCHC: 32 g/dL (ref 30.0–36.0)
MCV: 74.4 fL — ABNORMAL LOW (ref 80.0–100.0)
Monocytes Absolute: 0.5 10*3/uL (ref 0.1–1.0)
Monocytes Relative: 5 %
Neutro Abs: 9.1 10*3/uL — ABNORMAL HIGH (ref 1.7–7.7)
Neutrophils Relative %: 90 %
Platelets: 244 10*3/uL (ref 150–400)
RBC: 5.97 MIL/uL — ABNORMAL HIGH (ref 4.22–5.81)
RDW: 14 % (ref 11.5–15.5)
WBC: 10.1 10*3/uL (ref 4.0–10.5)
nRBC: 0 % (ref 0.0–0.2)

## 2019-11-09 LAB — C-REACTIVE PROTEIN: CRP: 2.4 mg/dL — ABNORMAL HIGH (ref <1.0)

## 2019-11-09 LAB — COMPREHENSIVE METABOLIC PANEL
ALT: 18 U/L (ref 0–44)
AST: 32 U/L (ref 15–41)
Albumin: 3 g/dL — ABNORMAL LOW (ref 3.5–5.0)
Alkaline Phosphatase: 29 U/L — ABNORMAL LOW (ref 38–126)
Anion gap: 13 (ref 5–15)
BUN: 20 mg/dL (ref 6–20)
CO2: 23 mmol/L (ref 22–32)
Calcium: 8.6 mg/dL — ABNORMAL LOW (ref 8.9–10.3)
Chloride: 101 mmol/L (ref 98–111)
Creatinine, Ser: 1.02 mg/dL (ref 0.61–1.24)
GFR calc Af Amer: >60 mL/min (ref >60)
GFR calc non Af Amer: >60 mL/min (ref >60)
Glucose, Bld: 155 mg/dL — ABNORMAL HIGH (ref 70–99)
Potassium: 4.7 mmol/L (ref 3.5–5.1)
Sodium: 137 mmol/L (ref 135–145)
Total Bilirubin: 0.7 mg/dL (ref 0.3–1.2)
Total Protein: 6.7 g/dL (ref 6.5–8.1)

## 2019-11-09 LAB — BRAIN NATRIURETIC PEPTIDE: B Natriuretic Peptide: 124.6 pg/mL — ABNORMAL HIGH (ref 0.0–100.0)

## 2019-11-09 LAB — D-DIMER, QUANTITATIVE: D-Dimer, Quant: 3.76 ug/mL-FEU — ABNORMAL HIGH (ref 0.00–0.50)

## 2019-11-09 LAB — MAGNESIUM: Magnesium: 2.3 mg/dL (ref 1.7–2.4)

## 2019-11-09 MED ORDER — PANTOPRAZOLE SODIUM 40 MG PO TBEC
40.0000 mg | DELAYED_RELEASE_TABLET | Freq: Every day | ORAL | Status: DC
  Administered 2019-11-09 – 2019-11-12 (×4): 40 mg via ORAL
  Filled 2019-11-09 (×4): qty 1

## 2019-11-09 MED ORDER — ALBUTEROL SULFATE HFA 108 (90 BASE) MCG/ACT IN AERS
10.0000 | INHALATION_SPRAY | Freq: Four times a day (QID) | RESPIRATORY_TRACT | Status: DC
  Administered 2019-11-09 – 2019-11-11 (×11): 10 via RESPIRATORY_TRACT
  Administered 2019-11-12: 6 via RESPIRATORY_TRACT
  Administered 2019-11-12: 2 via RESPIRATORY_TRACT
  Filled 2019-11-09: qty 6.7

## 2019-11-09 NOTE — Progress Notes (Signed)
Physical Therapy Treatment & Discharge Patient Details Name: Kevin Sanders MRN: [id number] DOB: 10/03/2000 Today's Date: 11/09/2019    History of Present Illness Pt is a 19 y/o male with PMHx including DM, HTN. Pt diagnosed with Covid, initially presented to [facility] and requiring 8LO2, however continued to have worsening respiratory status and was transferred to [facility]. Admitted with acute hypoxic respiratory failure d/t Covid 19 viral pneumonitis.    PT Comments    Pt progressing well with mobility. Independent with mobility and ambulation throughout room. SpO2 86-93% on 10L O2 HFNC. Pt motivated to participate and regain PLOF. Reviewed educ re: activity recommendations, current condition/O2 needs, seated/standing therex within confines of lines, IS use, pursed lip breathing and importance of more frequent activity. Pt has met short-term acute PT goals; has no further questions or concerns. Will d/c PT.   Follow Up Recommendations  No PT follow up     Equipment Recommendations  None recommended by PT    Recommendations for Other Services       Precautions / Restrictions Precautions Precautions: None Restrictions Weight Bearing Restrictions: No    Mobility  Bed Mobility               General bed mobility comments: Pt up in chair  Transfers Overall transfer level: Independent Equipment used: None Transfers: Sit to/from Stand              Ambulation/Gait Ambulation/Gait assistance: Nurse, learning disability (Feet): 400 Feet Assistive device: None;IV Pole Gait Pattern/deviations: WFL(Within Functional Limits)   Gait velocity interpretation: 1.31 - 2.62 ft/sec, indicative of limited community ambulator General Gait Details: Slow, steady gait walking multiple laps in room on 10L O2 HFNC; independent with and without pushing IV pole. SpO2 86-93% on 10L   Stairs             Wheelchair Mobility    Modified Rankin (Stroke Patients Only)        Balance Overall balance assessment: Independent   Sitting balance-Leahy Scale: Normal       Standing balance-Leahy Scale: Normal               High level balance activites: Side stepping;Backward walking;Turns;Direction changes;Sudden stops;Head turns High Level Balance Comments: No overt instability or LOB with higher level balance tasks            Cognition Arousal/Alertness: Awake/alert Behavior During Therapy: WFL for tasks assessed/performed Overall Cognitive Status: Within Functional Limits for tasks assessed                                        Exercises Other Exercises Other Exercises: Reviewed seated standing therex within confines of HFNC lines (including marching in place, mini squats, steps forwards/backwards) Other Exercises: Practiced IS use, pt with difficulty on correct technique, initially pulling 522m, able to increase to 750 mL with cues    General Comments        Pertinent Vitals/Pain Pain Assessment: No/denies pain    Home Living                      Prior Function            PT Goals (current goals can now be found in the care plan section) Progress towards PT goals: Goals met/education completed, patient discharged from PT    Frequency    Min 3X/week  PT Plan Current plan remains appropriate    Co-evaluation              AM-PAC PT "6 Clicks" Mobility   Outcome Measure  Help needed turning from your back to your side while in a flat bed without using bedrails?: None Help needed moving from lying on your back to sitting on the side of a flat bed without using bedrails?: None Help needed moving to and from a bed to a chair (including a wheelchair)?: None Help needed standing up from a chair using your arms (e.g., wheelchair or bedside chair)?: None Help needed to walk in hospital room?: None Help needed climbing 3-5 steps with a railing? : None 6 Click Score: 24    End of Session  Equipment Utilized During Treatment: Oxygen Activity Tolerance: Patient tolerated treatment well Patient left: in chair;with call bell/phone within reach Nurse Communication: Mobility status PT Visit Diagnosis: Other abnormalities of gait and mobility (R26.89)     Time: 5430-1484 PT Time Calculation (min) (ACUTE ONLY): 26 min  Charges:  $Therapeutic Exercise: 23-37 mins                    Mabeline Caras, PT, DPT Acute Rehabilitation Services  Pager [phone] Office [place] 11/09/2019, 12:51 PM

## 2019-11-09 NOTE — Progress Notes (Addendum)
PROGRESS NOTE                                                                                                                                                                                                             Patient Demographics:    Kevin Sanders, is a 19 y.o. male, DOB - [birth year], ZOX:[id number]  Outpatient Primary MD for the patient is Patient, No Pcp Per    LOS - 3  Admit date - 11/06/2019    Chief Complaint  Patient presents with  . Shortness of Breath covid pos       Brief Narrative - Kevin Sanders is a 19 y.o. male with a history of diabetes, hypertension.  Patient was seen in the ED yesterday and was diagnosed with COVID.  Started feeling ill 6 days ago, he is not vaccinated for Covid and was exposed to sick family members.  When [facility] ER he was placed on 8 L oxygen, despite aggressive treatment he continued to get worse and [facility] 15 L [facility].   Subjective:   Patient in bed, appears comfortable, denies any headache, no fever, no chest pain or pressure, improved shortness of breath , no abdominal pain. No focal weakness.    Assessment  & Plan :     1. Acute Hypoxic Resp. Failure due to Acute Covid 19 Viral Pneumonitis during the ongoing 2020 Covid 19 Pandemic - he is unfortunately not vaccinated and has incurred severe parenchymal lung injury due to COVID-19 pneumonia.  He has been placed on high-dose IV steroids, remdesivir and Baricitinib.  He has now showing signs of improvement, will continue to monitor closely.  Encouraged the patient to sit up in chair in the daytime use I-S and flutter valve for pulmonary toiletry and then prone in bed when at night.  Will advance activity and titrate down oxygen as possible.  SpO2: 91 % O2 Flow Rate (L/min): 15 L/min FiO2 (%): 100 %  Recent Labs  Lab 11/05/19 1627 11/05/19 1727 11/06/19 1353  11/06/19 1551 11/07/19 0535 11/07/19 0536 11/08/19 0233 11/09/19 0711  WBC  --  4.9 5.9  --  5.1  --  6.6 10.1  PLT  --  167 156  --  171  --  228 244  CRP  --   --  12.6*  --   --  14.7* 7.1* 2.4*  AST  --   --  73*  --  66*  --  44* 32  ALT  --   --  23  --  22  --  21 18  ALKPHOS  --   --  28*  --  25*  --  24* 29*  BILITOT  --   --  0.6  --  0.6  --  0.5 0.7  ALBUMIN  --   --  3.7  --  3.4*  --  3.1* 3.0*  DDIMER  --   --  2.30*  --  2.47*  --  2.94* 3.76*  PROCALCITON  --   --  0.18  --   --   --   --   --   LATICACIDVEN  --   --  0.9 1.1  --   --   --   --   SARSCOV2NAA POSITIVE*  --   --   --   --   --   --   --     2.  Morbid obesity.  BMI of 41.  Follow with PCP for weight loss.  3.  Essential hypertension.  Currently on Norvasc.  4.  Moderately elevated D-dimer due to intense inflammation.  Dimer is rising despite being on high dose of Lovenox will switch him to full dose as his risk of developing a blood clot is extremely high due to intense inflammation and morbid obesity.  We will also check lower extremity venous duplex.  5.  DM type II.  Continue combination of Lantus, sliding scale, and linagliptin, added premeal NovoLog for better control as well.  Lab Results  Component Value Date   HGBA1C 6.1 (A) 10/25/2019   CBG (last 3)  Recent Labs    11/08/19 1657 11/08/19 2051 11/09/19 0732  GLUCAP 162* 171* 163*      Condition - Extremely Guarded  Family Communication  : Mother Elmarie Shiley [phone] on 11/07/2019  Code Status :  Full  Consults  :  None  Procedures  :    Leg ultrasound.  PUD Prophylaxis : PPI  Disposition Plan  :    Status is: Inpatient  Remains inpatient appropriate because:IV treatments appropriate due to intensity of illness or inability to take PO   Dispo: The patient is from: Home              Anticipated d/c is to: Home              Anticipated d/c date is: > 3 days              Patient currently is not medically stable to  d/c.   DVT Prophylaxis  :  Lovenox   Lab Results  Component Value Date   PLT 244 11/09/2019    Diet :  Diet Order            Diet Carb Modified Fluid consistency: Thin; Room service appropriate? Yes  Diet effective now                  Inpatient Medications  Scheduled Meds: . albuterol  10 puff Inhalation QID  . vitamin C  500 mg Oral Daily  . baricitinib  4 mg Oral Daily  . enoxaparin (LOVENOX) injection  120 mg Subcutaneous Q12H  . insulin aspart  0-15 Units Subcutaneous TID WC  . insulin aspart  0-5 Units Subcutaneous QHS  . insulin  aspart  4 Units Subcutaneous TID WC  . insulin glargine  40 Units Subcutaneous Daily  . linagliptin  5 mg Oral Daily  . methylPREDNISolone (SOLU-MEDROL) injection  80 mg Intravenous Q12H  . metoprolol tartrate  25 mg Oral BID  . pneumococcal 23 valent vaccine  0.5 mL Intramuscular Tomorrow-1000  . zinc sulfate  220 mg Oral Daily   Continuous Infusions: . remdesivir 100 mg in NS 100 mL 100 mg (11/09/19 1035)   PRN Meds:.bisacodyl, chlorpheniramine-HYDROcodone, guaiFENesin-dextromethorphan, loperamide, metoprolol tartrate, [DISCONTINUED] ondansetron **OR** ondansetron (ZOFRAN) IV  Antibiotics  :    Anti-infectives (From admission, onward)   Start     Dose/Rate Route Frequency Ordered Stop   11/07/19 1000  remdesivir 100 mg in sodium chloride 0.9 % 100 mL IVPB       "Followed by" Linked Group Details   100 mg 100 mL/hr over 60 Minutes Intravenous Daily 11/06/19 1638 11/11/19 0959   11/06/19 1800  remdesivir 100 mg in sodium chloride 0.9 % 100 mL IVPB       "Followed by" Linked Group Details   100 mg 100 mL/hr over 60 Minutes Intravenous Once 11/06/19 1638 11/06/19 1940   11/06/19 1700  remdesivir 100 mg in sodium chloride 0.9 % 100 mL IVPB  Status:  Discontinued        100 mg 200 mL/hr over 30 Minutes Intravenous Once 11/06/19 1633 11/06/19 1635   11/06/19 1700  remdesivir 100 mg in sodium chloride 0.9 % 100 mL IVPB        "Followed by" Linked Group Details   100 mg 100 mL/hr over 60 Minutes Intravenous Once 11/06/19 1638 11/06/19 1819       Time Spent in minutes  30   Susa Raring M.D on 11/09/2019 at 11:57 AM  To page go to [email or web address] - password [facility]  [facility] -  Office  [phone]   See all Orders from today for further details    Objective:   Vitals:   11/09/19 0400 11/09/19 0456 11/09/19 0500 11/09/19 0740  BP: (!) 141/81   (!) 141/78  Pulse: 89  85 89  Resp: (!) 8  18 (!) 23  Temp: 98.1 F (36.7 C)   98.5 F (36.9 C)  TempSrc: Oral   Oral  SpO2: 91%  91% 91%  Weight:  117.7 kg    Height:        Wt Readings from Last 3 Encounters:  11/09/19 117.7 kg  11/05/19 128.4 kg  11/05/19 128 kg     Intake/Output Summary (Last 24 hours) at 11/09/2019 1157 Last data filed at 11/09/2019 0900 Gross per 24 hour  Intake 480 ml  Output 1000 ml  Net -520 ml     Physical Exam  Awake Alert, No new F.N deficits, Normal affect [place].AT,PERRAL Supple Neck,No JVD, No cervical lymphadenopathy appriciated.  Symmetrical Chest wall movement, Good air movement bilaterally, CTAB RRR,No Gallops, Rubs or new Murmurs, No Parasternal Heave +ve B.Sounds, Abd Soft, No tenderness, No organomegaly appriciated, No rebound - guarding or rigidity. No Cyanosis, Clubbing or edema, No new Rash or bruise    Data Review:    CBC Recent Labs  Lab 11/05/19 1727 11/06/19 1353 11/07/19 0535 11/08/19 0233 11/09/19 0711  WBC 4.9 5.9 5.1 6.6 10.1  HGB 15.5 15.6 15.2 15.0 14.2  HCT 47.1 47.3 46.1 45.6 44.4  PLT 167 156 171 228 244  MCV 75.1* 75.2* 75.2* 74.0* 74.4*  MCH 24.7* 24.8* 24.8* 24.4* 23.8*  MCHC 32.9 33.0  33.0 32.9 32.0  RDW 13.8 13.7 13.8 14.0 14.0  LYMPHSABS 0.8 0.8 0.6* 0.6* 0.4*  MONOABS 0.3 0.3 0.1 0.3 0.5  EOSABS 0.0 0.0 0.0 0.0 0.0  BASOSABS 0.0 0.0 0.0 0.0 0.0    Recent Labs  Lab 11/05/19 1727 11/06/19 1353 11/06/19 1551 11/07/19 0535 11/07/19 0536 11/08/19 0233  11/09/19 0711  NA 137 131*  --  135  --  137 137  K 3.4* 3.3*  --  3.9  --  4.4 4.7  CL 103 98  --  103  --  102 101  CO2 23 22  --  20*  --  24 23  GLUCOSE 87 93  --  114*  --  149* 155*  BUN 12 13  --  14  --  16 20  CREATININE 1.28* 1.20  --  0.92  --  0.90 1.02  CALCIUM 8.3* 8.2*  --  8.2*  --  8.8* 8.6*  AST  --  73*  --  66*  --  44* 32  ALT  --  23  --  22  --  21 18  ALKPHOS  --  28*  --  25*  --  24* 29*  BILITOT  --  0.6  --  0.6  --  0.5 0.7  ALBUMIN  --  3.7  --  3.4*  --  3.1* 3.0*  MG  --   --   --  2.1  --  2.2 2.3  CRP  --  12.6*  --   --  14.7* 7.1* 2.4*  DDIMER  --  2.30*  --  2.47*  --  2.94* 3.76*  PROCALCITON  --  0.18  --   --   --   --   --   LATICACIDVEN  --  0.9 1.1  --   --   --   --   BNP  --   --   --   --   --  17.5 124.6*    ------------------------------------------------------------------------------------------------------------------ Recent Labs    11/06/19 1353  TRIG 228*    Lab Results  Component Value Date   HGBA1C 6.1 (A) 10/25/2019   ------------------------------------------------------------------------------------------------------------------ No results for input(s): TSH, T4TOTAL, T3FREE, THYROIDAB in the last 72 hours.  Invalid input(s): FREET3  Cardiac Enzymes No results for input(s): CKMB, TROPONINI, MYOGLOBIN in the last 168 hours.  Invalid input(s): CK ------------------------------------------------------------------------------------------------------------------    Component Value Date/Time   BNP 124.6 (H) 11/09/2019 5916    Micro Results Recent Results (from the past 240 hour(s))  SARS Coronavirus 2 by RT PCR (hospital order, performed in [facility] hospital lab) Nasopharyngeal Nasopharyngeal Swab     Status: Abnormal   Collection Time: 11/05/19  4:27 PM   Specimen: Nasopharyngeal Swab  Result Value Ref Range Status   SARS Coronavirus 2 POSITIVE (A) NEGATIVE Final    Comment: RESULT CALLED TO, READ BACK BY  AND VERIFIED WITH: J APPELT,RN @1908  11/05/19 MKELLY (NOTE) SARS-CoV-2 target nucleic acids are DETECTED  SARS-CoV-2 RNA is generally detectable in upper respiratory specimens  during the acute phase of infection.  Positive results are indicative  of the presence of the identified virus, but do not rule out bacterial infection or co-infection with other pathogens not detected by the test.  Clinical correlation with patient history and  other diagnostic information is necessary to determine patient infection status.  The expected result is negative.  Fact Sheet for Patients:   11/07/19   Fact Sheet  for Healthcare Providers:   https://pope.com/    This test is not yet approved or cleared by the [place] and  has been authorized for detection and/or diagnosis of SARS-CoV-2 by FDA under an Emergency Use Authorization (EUA).  This EUA will remain in effect (meaning this tes t can be used) for the duration of  the COVID-19 declaration under Section 564(b)(1) of the Act, 21 U.S.C. section 360-bbb-3(b)(1), unless the authorization is terminated or revoked sooner.  Performed at [facility], [address] [place], [place] [address]   Blood Culture (routine x 2)     Status: None (Preliminary result)   Collection Time: 11/06/19  1:56 PM   Specimen: BLOOD  Result Value Ref Range Status   Specimen Description BLOOD  Final   Special Requests NONE  Final   Culture   Final    NO GROWTH 3 DAYS Performed at [facility], [address], [place], [place] [address]    Report Status PENDING  Incomplete  Blood Culture (routine x 2)     Status: None (Preliminary result)   Collection Time: 11/06/19  2:06 PM   Specimen: BLOOD  Result Value Ref Range Status   Specimen Description BLOOD  Final   Special Requests NONE  Final   Culture   Final    NO GROWTH 3 DAYS Performed at [facility], [address] [place], [place] [address]     Report Status PENDING  Incomplete    Radiology Reports CT Angio Chest PE W and/or Wo Contrast  Result Date: 11/06/2019 CLINICAL DATA:  Shortness of breath, oxygen desaturation, COVID-19 positive EXAM: CT ANGIOGRAPHY CHEST WITH CONTRAST TECHNIQUE: Multidetector CT imaging of the chest was performed using the standard protocol during bolus administration of intravenous contrast. Multiplanar CT image reconstructions and MIPs were obtained to evaluate the vascular anatomy. Initial contrast opacification of the pulmonary arteries was nondiagnostic for assessment for pulmonary embolism and patient was reinjected/repeated. CONTRAST:  Total of OMNIPAQUE IOHEXOL 350 MG/ML SOLN IV COMPARISON:  None FINDINGS: Cardiovascular: Aorta normal caliber without aneurysm or dissection. Heart unremarkable. No pericardial effusion. Pulmonary arteries adequately opacified following reinjection. Pulmonary arteries appear patent. No evidence of pulmonary embolism. Mediastinum/Nodes: Base of cervical region normal appearance. Esophagus unremarkable. No thoracic adenopathy. Lungs/Pleura: Patchy BILATERAL airspace infiltrates throughout all lobes consistent with multifocal pneumonia and history of COVID-19. No pleural effusion or pneumothorax. Upper Abdomen: Unremarkable Musculoskeletal: No acute osseous findings. Review of the MIP images confirms the above findings. IMPRESSION: No evidence of pulmonary embolism. Patchy BILATERAL airspace infiltrates throughout all lobes consistent with multifocal pneumonia and COVID-19. Electronically Signed   By: Ulyses Southward M.D.   On: 11/06/2019 15:29   DG Chest Port 1 View  Result Date: 11/05/2019 CLINICAL DATA:  Shortness of breath, possible COVID EXAM: PORTABLE CHEST 1 VIEW COMPARISON:  07/29/2019 FINDINGS: Patchy bilateral nodular airspace disease noted. Low lung volumes. Heart is normal size. No effusions or pneumothorax. No acute bony abnormality. IMPRESSION: Patchy bilateral  nodular airspace disease most compatible with COVID pneumonia. Electronically Signed   By: Charlett Nose M.D.   On: 11/05/2019 17:25   VAS [place] LOWER EXTREMITY VENOUS (DVT)  Result Date: 11/07/2019  Lower Venous DVTStudy Indications: Elevated d dimer.  Comparison Study: No prior studies. Performing Technologist: Jean Rosenthal  Examination Guidelines: A complete evaluation includes B-mode imaging, spectral Doppler, color Doppler, and power Doppler as needed of all accessible portions of each vessel. Bilateral testing is considered an integral part of a complete  examination. Limited examinations for reoccurring indications may be performed as noted. The reflux portion of the exam is performed with the patient in reverse Trendelenburg.  +---------+---------------+---------+-----------+----------+--------------+ RIGHT    CompressibilityPhasicitySpontaneityPropertiesThrombus Aging +---------+---------------+---------+-----------+----------+--------------+ CFV      Full           Yes      Yes                                 +---------+---------------+---------+-----------+----------+--------------+ SFJ      Full                                                        +---------+---------------+---------+-----------+----------+--------------+ FV Prox  Full                                                        +---------+---------------+---------+-----------+----------+--------------+ FV Mid   Full                                                        +---------+---------------+---------+-----------+----------+--------------+ FV DistalFull                                                        +---------+---------------+---------+-----------+----------+--------------+ PFV      Full                                                        +---------+---------------+---------+-----------+----------+--------------+ POP      Full           Yes      Yes                                  +---------+---------------+---------+-----------+----------+--------------+ PTV      Full                                                        +---------+---------------+---------+-----------+----------+--------------+ PERO     Full                                                        +---------+---------------+---------+-----------+----------+--------------+   +---------+---------------+---------+-----------+----------+--------------+ LEFT     CompressibilityPhasicitySpontaneityPropertiesThrombus Aging +---------+---------------+---------+-----------+----------+--------------+ CFV      Full  Yes      Yes                                 +---------+---------------+---------+-----------+----------+--------------+ SFJ      Full                                                        +---------+---------------+---------+-----------+----------+--------------+ FV Prox  Full                                                        +---------+---------------+---------+-----------+----------+--------------+ FV Mid   Full                                                        +---------+---------------+---------+-----------+----------+--------------+ FV DistalFull                                                        +---------+---------------+---------+-----------+----------+--------------+ PFV      Full                                                        +---------+---------------+---------+-----------+----------+--------------+ POP      Full           Yes      Yes                                 +---------+---------------+---------+-----------+----------+--------------+ PTV      Full                                                        +---------+---------------+---------+-----------+----------+--------------+ PERO     Full                                                         +---------+---------------+---------+-----------+----------+--------------+     Summary: RIGHT: - There is no evidence of deep vein thrombosis in the lower extremity.  - No cystic structure found in the popliteal fossa.  LEFT: - There is no evidence of deep vein thrombosis in the lower extremity.  - No cystic structure found in the popliteal fossa.  *See table(s) above for measurements and observations. Electronically signed by Lemar Livings MD on 11/07/2019 at 7:41:34  PM.    Final

## 2019-11-10 LAB — COMPREHENSIVE METABOLIC PANEL
ALT: 17 U/L (ref 0–44)
AST: 26 U/L (ref 15–41)
Albumin: 2.9 g/dL — ABNORMAL LOW (ref 3.5–5.0)
Alkaline Phosphatase: 26 U/L — ABNORMAL LOW (ref 38–126)
Anion gap: 12 (ref 5–15)
BUN: 19 mg/dL (ref 6–20)
CO2: 24 mmol/L (ref 22–32)
Calcium: 8.6 mg/dL — ABNORMAL LOW (ref 8.9–10.3)
Chloride: 101 mmol/L (ref 98–111)
Creatinine, Ser: 0.82 mg/dL (ref 0.61–1.24)
GFR calc Af Amer: >60 mL/min (ref >60)
GFR calc non Af Amer: >60 mL/min (ref >60)
Glucose, Bld: 144 mg/dL — ABNORMAL HIGH (ref 70–99)
Potassium: 4.5 mmol/L (ref 3.5–5.1)
Sodium: 137 mmol/L (ref 135–145)
Total Bilirubin: 0.6 mg/dL (ref 0.3–1.2)
Total Protein: 6.2 g/dL — ABNORMAL LOW (ref 6.5–8.1)

## 2019-11-10 LAB — CBC WITH DIFFERENTIAL/PLATELET
Abs Immature Granulocytes: 0.08 10*3/uL — ABNORMAL HIGH (ref 0.00–0.07)
Basophils Absolute: 0 10*3/uL (ref 0.0–0.1)
Basophils Relative: 0 %
Eosinophils Absolute: 0 10*3/uL (ref 0.0–0.5)
Eosinophils Relative: 0 %
HCT: 43.5 % (ref 39.0–52.0)
Hemoglobin: 14 g/dL (ref 13.0–17.0)
Immature Granulocytes: 1 %
Lymphocytes Relative: 8 %
Lymphs Abs: 0.6 10*3/uL — ABNORMAL LOW (ref 0.7–4.0)
MCH: 23.8 pg — ABNORMAL LOW (ref 26.0–34.0)
MCHC: 32.2 g/dL (ref 30.0–36.0)
MCV: 73.9 fL — ABNORMAL LOW (ref 80.0–100.0)
Monocytes Absolute: 0.5 10*3/uL (ref 0.1–1.0)
Monocytes Relative: 6 %
Neutro Abs: 6.8 10*3/uL (ref 1.7–7.7)
Neutrophils Relative %: 85 %
Platelets: 294 10*3/uL (ref 150–400)
RBC: 5.89 MIL/uL — ABNORMAL HIGH (ref 4.22–5.81)
RDW: 13.8 % (ref 11.5–15.5)
WBC: 8 10*3/uL (ref 4.0–10.5)
nRBC: 0 % (ref 0.0–0.2)

## 2019-11-10 LAB — GLUCOSE, CAPILLARY
Glucose-Capillary: 144 mg/dL — ABNORMAL HIGH (ref 70–99)
Glucose-Capillary: 188 mg/dL — ABNORMAL HIGH (ref 70–99)
Glucose-Capillary: 123 mg/dL — ABNORMAL HIGH (ref 70–99)
Glucose-Capillary: 237 mg/dL — ABNORMAL HIGH (ref 70–99)

## 2019-11-10 LAB — D-DIMER, QUANTITATIVE: D-Dimer, Quant: 2.76 ug/mL-FEU — ABNORMAL HIGH (ref 0.00–0.50)

## 2019-11-10 LAB — C-REACTIVE PROTEIN: CRP: 2.9 mg/dL — ABNORMAL HIGH (ref <1.0)

## 2019-11-10 LAB — MAGNESIUM: Magnesium: 2.6 mg/dL — ABNORMAL HIGH (ref 1.7–2.4)

## 2019-11-10 LAB — BRAIN NATRIURETIC PEPTIDE: B Natriuretic Peptide: 42.5 pg/mL (ref 0.0–100.0)

## 2019-11-10 MED ORDER — INSULIN GLARGINE 100 UNIT/ML ~~LOC~~ SOLN
25.0000 [IU] | Freq: Every day | SUBCUTANEOUS | Status: DC
  Administered 2019-11-10 – 2019-11-12 (×3): 25 [IU] via SUBCUTANEOUS
  Filled 2019-11-10 (×3): qty 0.25

## 2019-11-10 MED ORDER — LACTATED RINGERS IV SOLN: INTRAVENOUS | Status: AC

## 2019-11-10 MED ORDER — METHYLPREDNISOLONE SODIUM SUCC 40 MG IJ SOLR
40.0000 mg | Freq: Two times a day (BID) | INTRAMUSCULAR | Status: DC
  Administered 2019-11-10 – 2019-11-12 (×4): 40 mg via INTRAVENOUS
  Filled 2019-11-10 (×4): qty 1

## 2019-11-10 NOTE — Progress Notes (Signed)
SATURATION QUALIFICATIONS: (This note is used to comply with regulatory documentation for home oxygen)  Patient Saturations on Room Air at Rest = 92%  Patient Saturations on Room Air while Ambulating = 83%  Patient Saturations on 6 Liters of oxygen while Ambulating = 90%  Please briefly explain why patient needs home oxygen: Pt still requiring large amounts of oxygen at this time to keep saturations above 88%.

## 2019-11-10 NOTE — Progress Notes (Addendum)
PROGRESS NOTE                                                                                                                                                                                                             Patient Demographics:    Kevin Sanders, is a 19 y.o. male, DOB - October 20, 2000, WUJ:[id number]  Outpatient Primary MD for the patient is Patient, No Pcp Per    LOS - 4  Admit date - 8/28[place]/2021    Chief Complaint  Patient presents with  . Shortness of Breath covid pos       Brief Narrative - Kevin Sanders is a 19 y.o. male with a history of diabetes, hypertension.  Patient was seen in the ED yesterday and was diagnosed with COVID.  Started feeling ill 6 days ago, he is not vaccinated for Covid and was exposed to sick family members.  When he came to [facility] ER he was placed on 8 L oxygen, despite aggressive treatment he continued to get worse and [facility].   Subjective:   Patient in bed, appears comfortable, denies any headache, no fever, no chest pain or pressure, much improved shortness of breath , no abdominal pain. No focal weakness.   Assessment  & Plan :     1. Acute Hypoxic Resp. Failure due to Acute Covid 19 Viral Pneumonitis during the ongoing 2020 Covid 19 Pandemic - he is unfortunately not vaccinated and has incurred severe parenchymal lung injury due to COVID-19 pneumonia.  He has been placed on high-dose IV steroids, remdesivir and Baricitinib.  He has now showing signs of improvement, will continue to monitor closely.  Encouraged the patient to sit up in chair in the daytime use I-S and flutter valve for pulmonary toiletry and then prone in bed when at night.  Will advance activity and titrate down oxygen as possible.  SpO2: 94 % O2 Flow Rate (L/min): 8 L/min FiO2 (%): 100 %  Recent Labs  Lab 11/05/19 1627 11/05/19 1727 11/06/19 1353  11/06/19 1551 11/07/19 0535 11/07/19 0536 11/08/19 0233 11/09/19 0711 11/10/19 0355  WBC  --    < > 5.9  --  5.1  --  6.6 10.1 8.0  PLT  --    < > 156  --  171  --  228 244 294  CRP  --   --  12.6*  --   --  14.7* 7.1* 2.4* 2.9*  AST  --   --  73*  --  66*  --  44* 32 26  ALT  --   --  23  --  22  --  ALKPHOS  --   --  28*  --  25*  --  24* 29* 26*  BILITOT  --   --  0.6  --  0.6  --  0.5 0.7 0.6  ALBUMIN  --   --  3.7  --  3.4*  --  3.1* 3.0* 2.9*  DDIMER  --   --  2.30*  --  2.47*  --  2.94* 3.76* 2.76*  PROCALCITON  --   --  0.18  --   --   --   --   --   --   LATICACIDVEN  --   --  0.9 1.1  --   --   --   --   --   SARSCOV2NAA POSITIVE*  --   --   --   --   --   --   --   --    < > = values in this interval not displayed.    2.  Morbid obesity.  BMI of 41.  Follow with PCP for weight loss.  3.  Essential hypertension.  Currently on Norvasc.  4.  Moderately elevated D-dimer due to intense inflammation.  Negative CTA and leg ultrasound, D-dimer still elevated, risk of developing a blood clot remains very high due to intense inflammation, continue full dose Lovenox.  5.  DM type II.  Continue combination of Lantus, sliding scale, and linagliptin, added premeal NovoLog for better control as well.  Lab Results  Component Value Date   HGBA1C 6.1 (A) 10/25/2019   CBG (last 3)  Recent Labs    11/09/19 2102 11/10/19 0740 11/10/19 1152  GLUCAP 171* 144* 188*      Condition - Extremely Guarded  Family Communication  : Mother Kevin Sanders [phone] on 11/07/2019, patient now chooses to update family himself.  Code Status :  Full  Consults  :  None  Procedures  :    CTA.  No PE.    Leg ultrasound.  No DVT  PUD Prophylaxis : PPI  Disposition Plan  :    Status is: Inpatient  Remains inpatient appropriate because:IV treatments appropriate due to intensity of illness or inability to take PO   Dispo: The patient is from: Home              Anticipated d/c  is to: Home              Anticipated d/c date is: > 3 days              Patient currently is not medically stable to d/c.   DVT Prophylaxis  :  Lovenox   Lab Results  Component Value Date   PLT 294 11/10/2019    Diet :  Diet Order            Diet Carb Modified Fluid consistency: Thin; Room service appropriate? Yes  Diet effective now                  Inpatient Medications  Scheduled Meds: . albuterol  10 puff Inhalation QID  . vitamin C  500 mg Oral Daily  . baricitinib  4 mg Oral Daily  . enoxaparin (LOVENOX) injection  120 mg Subcutaneous Q12H  . insulin aspart  0-15 Units Subcutaneous TID WC  . insulin aspart  0-5 Units Subcutaneous QHS  . insulin aspart  4 Units Subcutaneous TID WC  . insulin glargine  25 Units Subcutaneous Daily  . linagliptin  5 mg Oral Daily  . methylPREDNISolone (SOLU-MEDROL) injection  40 mg Intravenous BID  . metoprolol tartrate  25 mg Oral BID  . pantoprazole  40 mg Oral Daily  . pneumococcal 23 valent vaccine  0.5 mL Intramuscular Tomorrow-1000  . zinc sulfate  220 mg Oral Daily   Continuous Infusions: . lactated ringers 100 mL/hr at 11/10/19 0824   PRN Meds:.bisacodyl, chlorpheniramine-HYDROcodone, guaiFENesin-dextromethorphan, loperamide, metoprolol tartrate, [DISCONTINUED] ondansetron **OR** ondansetron (ZOFRAN) IV  Antibiotics  :    Anti-infectives (From admission, onward)   Start     Dose/Rate Route Frequency Ordered Stop   11/07/19 1000  remdesivir 100 mg in sodium chloride 0.9 % 100 mL IVPB       "Followed by" Linked Group Details   100 mg 100 mL/hr over 60 Minutes Intravenous Daily 11/06/19 1638 11/10/19 1056   11/06/19 1800  remdesivir 100 mg in sodium chloride 0.9 % 100 mL IVPB       "Followed by" Linked Group Details   100 mg 100 mL/hr over 60 Minutes Intravenous Once 11/06/19 1638 11/06/19 1940   11/06/19 1700  remdesivir 100 mg in sodium chloride 0.9 % 100 mL IVPB  Status:  Discontinued        100 mg 200 mL/hr over  30 Minutes Intravenous Once 11/06/19 1633 11/06/19 1635   11/06/19 1700  remdesivir 100 mg in sodium chloride 0.9 % 100 mL IVPB       "Followed by" Linked Group Details   100 mg 100 mL/hr over 60 Minutes Intravenous Once 11/06/19 1638 11/06/19 1819       Time Spent in minutes  30   Susa Raring M.D on 11/10/2019 at 11:58 AM  To page go to [email or web address] - password [facility]  [facility] -  Office  [phone]   See all Orders from today for further details    Objective:   Vitals:   11/10/19 0400 11/10/19 0743 11/10/19 0949 11/10/19 1152  BP:  (!) 145/93  139/87  Pulse:  85 82 86  Resp:  Temp:  98.3 F (36.8 C)  98.2 F (36.8 C)  TempSrc:  Oral  Oral  SpO2:  93%  94%  Weight: 116.6 kg     Height:        Wt Readings from Last 3 Encounters:  11/10/19 116.6 kg  11/05/19 128.4 kg  11/05/19 128 kg     Intake/Output Summary (Last 24 hours) at 11/10/2019 1158 Last data filed at 11/10/2019 0527 Gross per 24 hour  Intake 880 ml  Output 1600 ml  Net -720 ml     Physical Exam  Awake Alert, No new F.N deficits, Normal affect [place].AT,PERRAL Supple Neck,No JVD, No cervical lymphadenopathy appriciated.  Symmetrical Chest wall movement, Good air movement bilaterally, CTAB RRR,No Gallops, Rubs or new Murmurs, No Parasternal Heave +ve B.Sounds, Abd Soft, No tenderness, No organomegaly appriciated, No rebound - guarding or rigidity. No Cyanosis, Clubbing or edema, No new Rash or bruise    Data Review:    CBC Recent Labs  Lab 11/06/19 1353 11/07/19 0535 11/08/19 0233 11/09/19 0711 11/10/19 0355  WBC 5.9 5.1 6.6 10.1 8.0  HGB  15.6 15.2 15.0 14.2 14.0  HCT 47.3 46.1 45.6 44.4 43.5  PLT 156 171 228 244 294  MCV 75.2* 75.2* 74.0* 74.4* 73.9*  MCH 24.8* 24.8* 24.4* 23.8* 23.8*  MCHC 33.0 33.0 32.9 32.0 32.2  RDW 13.7 13.8 14.0 14.0 13.8  LYMPHSABS 0.8 0.6* 0.6* 0.4* 0.6*  MONOABS 0.3 0.1 0.3 0.5 0.5  EOSABS 0.0 0.0 0.0 0.0 0.0  BASOSABS 0.0 0.0 0.0 0.0  0.0    Recent Labs  Lab 11/06/19 1353 11/06/19 1551 11/07/19 0535 11/07/19 0536 11/08/19 0233 11/09/19 0711 11/10/19 0355  NA 131*  --  135  --  137 137 137  K 3.3*  --  3.9  --  4.4 4.7 4.5  CL 98  --  103  --  102 101 101  CO2 22  --  20*  --  24 23 24   GLUCOSE 93  --  114*  --  149* 155* 144*  BUN 13  --  14  --  16 20 19   CREATININE 1.20  --  0.92  --  0.90 1.02 0.82  CALCIUM 8.2*  --  8.2*  --  8.8* 8.6* 8.6*  AST 73*  --  66*  --  44* 32 26  ALT 23  --  22  --  21 18 17   ALKPHOS 28*  --  25*  --  24* 29* 26*  BILITOT 0.6  --  0.6  --  0.5 0.7 0.6  ALBUMIN 3.7  --  3.4*  --  3.1* 3.0* 2.9*  MG  --   --  2.1  --  2.2 2.3 2.6*  CRP 12.6*  --   --  14.7* 7.1* 2.4* 2.9*  DDIMER 2.30*  --  2.47*  --  2.94* 3.76* 2.76*  PROCALCITON 0.18  --   --   --   --   --   --   LATICACIDVEN 0.9 1.1  --   --   --   --   --   BNP  --   --   --   --  17.5 124.6* 42.5    ------------------------------------------------------------------------------------------------------------------ No results for input(s): CHOL, HDL, LDLCALC, TRIG, CHOLHDL, LDLDIRECT in the last 72 hours.  Lab Results  Component Value Date   HGBA1C 6.1 (A) 10/25/2019   ------------------------------------------------------------------------------------------------------------------ No results for input(s): TSH, T4TOTAL, T3FREE, THYROIDAB in the last 72 hours.  Invalid input(s): FREET3  Cardiac Enzymes No results for input(s): CKMB, TROPONINI, MYOGLOBIN in the last 168 hours.  Invalid input(s): CK ------------------------------------------------------------------------------------------------------------------    Component Value Date/Time   BNP 42.5 11/10/2019 0355    Micro Results Recent Results (from the past 240 hour(s))  SARS Coronavirus 2 by RT PCR (hospital order, performed in [facility] hospital lab) Nasopharyngeal Nasopharyngeal Swab     Status: Abnormal   Collection Time: 11/05/19  4:27 PM    Specimen: Nasopharyngeal Swab  Result Value Ref Range Status   SARS Coronavirus 2 POSITIVE (A) NEGATIVE Final    Comment: RESULT CALLED TO, READ BACK BY AND VERIFIED WITH: J APPELT,RN @1908  11/05/19 MKELLY (NOTE) SARS-CoV-2 target nucleic acids are DETECTED  SARS-CoV-2 RNA is generally detectable in upper respiratory specimens  during the acute phase of infection.  Positive results are indicative  of the presence of the identified virus, but do not rule out bacterial infection or co-infection with other pathogens not detected by the test.  Clinical correlation with patient history and  other diagnostic information is necessary to determine  patient infection status.  The expected result is negative.  Fact Sheet for Patients:   BoilerBrush.com.cy   Fact Sheet for Healthcare Providers:   https://pope.com/    This test is not yet approved or cleared by the [place] FDA and  has been authorized for detection and/or diagnosis of SARS-CoV-2 by FDA under an Emergency Use Authorization (EUA).  This EUA will remain in effect (meaning this tes t can be used) for the duration of  the COVID-19 declaration under Section 564(b)(1) of the Act, 21 U.S.C. section 360-bbb-3(b)(1), unless the authorization is terminated or revoked sooner.  Performed at [facility], [address], [place], [place] [address]   Blood Culture (routine x 2)     Status: None (Preliminary result)   Collection Time: 11/06/19  1:56 PM   Specimen: BLOOD  Result Value Ref Range Status   Specimen Description BLOOD  Final   Special Requests NONE  Final   Culture   Final    NO GROWTH 3 DAYS Performed at [facility], [address] [place], [place] [address]    Report Status PENDING  Incomplete  Blood Culture (routine x 2)     Status: None (Preliminary result)   Collection Time: 11/06/19  2:06 PM   Specimen: BLOOD  Result Value Ref Range Status   Specimen Description  BLOOD  Final   Special Requests NONE  Final   Culture   Final    NO GROWTH 3 DAYS Performed at [facility], [address] [place], [place] [address]    Report Status PENDING  Incomplete    Radiology Reports CT Angio Chest PE W and/or Wo Contrast  Result Date: 11/06/2019 CLINICAL DATA:  Shortness of breath, oxygen desaturation, COVID-19 positive EXAM: CT ANGIOGRAPHY CHEST WITH CONTRAST TECHNIQUE: Multidetector CT imaging of the chest was performed using the standard protocol during bolus administration of intravenous contrast. Multiplanar CT image reconstructions and MIPs were obtained to evaluate the vascular anatomy. Initial contrast opacification of the pulmonary arteries was nondiagnostic for assessment for pulmonary embolism and patient was reinjected/repeated. CONTRAST:  Total of OMNIPAQUE IOHEXOL 350 MG/ML SOLN IV COMPARISON:  None FINDINGS: Cardiovascular: Aorta normal caliber without aneurysm or dissection. Heart unremarkable. No pericardial effusion. Pulmonary arteries adequately opacified following reinjection. Pulmonary arteries appear patent. No evidence of pulmonary embolism. Mediastinum/Nodes: Base of cervical region normal appearance. Esophagus unremarkable. No thoracic adenopathy. Lungs/Pleura: Patchy BILATERAL airspace infiltrates throughout all lobes consistent with multifocal pneumonia and history of COVID-19. No pleural effusion or pneumothorax. Upper Abdomen: Unremarkable Musculoskeletal: No acute osseous findings. Review of the MIP images confirms the above findings. IMPRESSION: No evidence of pulmonary embolism. Patchy BILATERAL airspace infiltrates throughout all lobes consistent with multifocal pneumonia and COVID-19. Electronically Signed   By: Ulyses Southward M.D.   On: 11/06/2019 15:29   DG Chest Port 1 View  Result Date: 11/05/2019 CLINICAL DATA:  Shortness of breath, possible COVID EXAM: PORTABLE CHEST 1 VIEW COMPARISON:  07/29/2019 FINDINGS: Patchy bilateral nodular  airspace disease noted. Low lung volumes. Heart is normal size. No effusions or pneumothorax. No acute bony abnormality. IMPRESSION: Patchy bilateral nodular airspace disease most compatible with COVID pneumonia. Electronically Signed   By: Charlett Nose M.D.   On: 11/05/2019 17:25   VAS [place] LOWER EXTREMITY VENOUS (DVT)  Result Date: 11/09/2019  Lower Venous DVTStudy Indications: Rapidly rising D dimer, Covid +.  Performing Technologist: Levada Schilling RDMS, RVT  Examination Guidelines: A complete evaluation includes B-mode imaging, spectral Doppler, color Doppler, and  power Doppler as needed of all accessible portions of each vessel. Bilateral testing is considered an integral part of a complete examination. Limited examinations for reoccurring indications may be performed as noted. The reflux portion of the exam is performed with the patient in reverse Trendelenburg.  +---------+---------------+---------+-----------+----------+--------------+ RIGHT    CompressibilityPhasicitySpontaneityPropertiesThrombus Aging +---------+---------------+---------+-----------+----------+--------------+ CFV      Full           Yes      Yes                                 +---------+---------------+---------+-----------+----------+--------------+ SFJ      Full                                                        +---------+---------------+---------+-----------+----------+--------------+ FV Prox  Full                                                        +---------+---------------+---------+-----------+----------+--------------+ FV Mid   Full                                                        +---------+---------------+---------+-----------+----------+--------------+ FV DistalFull                                                        +---------+---------------+---------+-----------+----------+--------------+ PFV      Full                                                         +---------+---------------+---------+-----------+----------+--------------+ POP      Full           Yes      Yes                                 +---------+---------------+---------+-----------+----------+--------------+ PTV      Full                                                        +---------+---------------+---------+-----------+----------+--------------+ PERO     Full                                                        +---------+---------------+---------+-----------+----------+--------------+  GSV      Full                                                        +---------+---------------+---------+-----------+----------+--------------+   +---------+---------------+---------+-----------+----------+-------------------+ LEFT     CompressibilityPhasicitySpontaneityPropertiesThrombus Aging      +---------+---------------+---------+-----------+----------+-------------------+ CFV      Full           Yes      Yes                                      +---------+---------------+---------+-----------+----------+-------------------+ SFJ      Full                                                             +---------+---------------+---------+-----------+----------+-------------------+ FV Prox  Full                                                             +---------+---------------+---------+-----------+----------+-------------------+ FV Mid   Full                                                             +---------+---------------+---------+-----------+----------+-------------------+ FV DistalFull                                                             +---------+---------------+---------+-----------+----------+-------------------+ PFV      Full                                                             +---------+---------------+---------+-----------+----------+-------------------+ POP      Full           Yes      Yes                                       +---------+---------------+---------+-----------+----------+-------------------+ PTV      Full                                                             +---------+---------------+---------+-----------+----------+-------------------+  PERO     Full                                         not well visualized +---------+---------------+---------+-----------+----------+-------------------+ GSV      Full                                                             +---------+---------------+---------+-----------+----------+-------------------+     Summary: RIGHT: - There is no evidence of deep vein thrombosis in the lower extremity.  - No cystic structure found in the popliteal fossa.  LEFT: - There is no evidence of deep vein thrombosis in the lower extremity. However, portions of this examination were limited- see technologist comments above.  - No cystic structure found in the popliteal fossa.  *See table(s) above for measurements and observations. Electronically signed by Coral Else MD on 11/09/2019 at 8:49:28 PM.    Final    VAS [place] LOWER EXTREMITY VENOUS (DVT)  Result Date: 11/07/2019  Lower Venous DVTStudy Indications: Elevated d dimer.  Comparison Study: No prior studies. Performing Technologist: Jean Rosenthal  Examination Guidelines: A complete evaluation includes B-mode imaging, spectral Doppler, color Doppler, and power Doppler as needed of all accessible portions of each vessel. Bilateral testing is considered an integral part of a complete examination. Limited examinations for reoccurring indications may be performed as noted. The reflux portion of the exam is performed with the patient in reverse Trendelenburg.  +---------+---------------+---------+-----------+----------+--------------+ RIGHT    CompressibilityPhasicitySpontaneityPropertiesThrombus Aging +---------+---------------+---------+-----------+----------+--------------+ CFV       Full           Yes      Yes                                 +---------+---------------+---------+-----------+----------+--------------+ SFJ      Full                                                        +---------+---------------+---------+-----------+----------+--------------+ FV Prox  Full                                                        +---------+---------------+---------+-----------+----------+--------------+ FV Mid   Full                                                        +---------+---------------+---------+-----------+----------+--------------+ FV DistalFull                                                        +---------+---------------+---------+-----------+----------+--------------+  PFV      Full                                                        +---------+---------------+---------+-----------+----------+--------------+ POP      Full           Yes      Yes                                 +---------+---------------+---------+-----------+----------+--------------+ PTV      Full                                                        +---------+---------------+---------+-----------+----------+--------------+ PERO     Full                                                        +---------+---------------+---------+-----------+----------+--------------+   +---------+---------------+---------+-----------+----------+--------------+ LEFT     CompressibilityPhasicitySpontaneityPropertiesThrombus Aging +---------+---------------+---------+-----------+----------+--------------+ CFV      Full           Yes      Yes                                 +---------+---------------+---------+-----------+----------+--------------+ SFJ      Full                                                        +---------+---------------+---------+-----------+----------+--------------+ FV Prox  Full                                                         +---------+---------------+---------+-----------+----------+--------------+ FV Mid   Full                                                        +---------+---------------+---------+-----------+----------+--------------+ FV DistalFull                                                        +---------+---------------+---------+-----------+----------+--------------+ PFV      Full                                                        +---------+---------------+---------+-----------+----------+--------------+  POP      Full           Yes      Yes                                 +---------+---------------+---------+-----------+----------+--------------+ PTV      Full                                                        +---------+---------------+---------+-----------+----------+--------------+ PERO     Full                                                        +---------+---------------+---------+-----------+----------+--------------+     Summary: RIGHT: - There is no evidence of deep vein thrombosis in the lower extremity.  - No cystic structure found in the popliteal fossa.  LEFT: - There is no evidence of deep vein thrombosis in the lower extremity.  - No cystic structure found in the popliteal fossa.  *See table(s) above for measurements and observations. Electronically signed by Lemar Livings MD on 11/07/2019 at 7:41:34 PM.    Final

## 2019-11-11 LAB — COMPREHENSIVE METABOLIC PANEL
ALT: 25 U/L (ref 0–44)
AST: 37 U/L (ref 15–41)
Albumin: 3.2 g/dL — ABNORMAL LOW (ref 3.5–5.0)
Alkaline Phosphatase: 28 U/L — ABNORMAL LOW (ref 38–126)
Anion gap: 9 (ref 5–15)
BUN: 20 mg/dL (ref 6–20)
CO2: 26 mmol/L (ref 22–32)
Calcium: 8.9 mg/dL (ref 8.9–10.3)
Chloride: 101 mmol/L (ref 98–111)
Creatinine, Ser: 1.06 mg/dL (ref 0.61–1.24)
GFR calc Af Amer: >60 mL/min (ref >60)
GFR calc non Af Amer: >60 mL/min (ref >60)
Glucose, Bld: 126 mg/dL — ABNORMAL HIGH (ref 70–99)
Potassium: 4.8 mmol/L (ref 3.5–5.1)
Sodium: 136 mmol/L (ref 135–145)
Total Bilirubin: 0.6 mg/dL (ref 0.3–1.2)
Total Protein: 6.8 g/dL (ref 6.5–8.1)

## 2019-11-11 LAB — CBC WITH DIFFERENTIAL/PLATELET
Abs Immature Granulocytes: 0 10*3/uL (ref 0.00–0.07)
Basophils Absolute: 0 10*3/uL (ref 0.0–0.1)
Basophils Relative: 0 %
Eosinophils Absolute: 0 10*3/uL (ref 0.0–0.5)
Eosinophils Relative: 0 %
HCT: 49.6 % (ref 39.0–52.0)
Hemoglobin: 16.1 g/dL (ref 13.0–17.0)
Lymphocytes Relative: 10 %
Lymphs Abs: 1.2 10*3/uL (ref 0.7–4.0)
MCH: 24.4 pg — ABNORMAL LOW (ref 26.0–34.0)
MCHC: 32.5 g/dL (ref 30.0–36.0)
MCV: 75.2 fL — ABNORMAL LOW (ref 80.0–100.0)
Monocytes Absolute: 0.5 10*3/uL (ref 0.1–1.0)
Monocytes Relative: 4 %
Neutro Abs: 10.2 10*3/uL — ABNORMAL HIGH (ref 1.7–7.7)
Neutrophils Relative %: 86 %
Platelets: 448 10*3/uL — ABNORMAL HIGH (ref 150–400)
RBC: 6.6 MIL/uL — ABNORMAL HIGH (ref 4.22–5.81)
RDW: 14.4 % (ref 11.5–15.5)
WBC: 11.9 10*3/uL — ABNORMAL HIGH (ref 4.0–10.5)
nRBC: 0 % (ref 0.0–0.2)
nRBC: 0 /100 WBC

## 2019-11-11 LAB — GLUCOSE, CAPILLARY
Glucose-Capillary: 101 mg/dL — ABNORMAL HIGH (ref 70–99)
Glucose-Capillary: 116 mg/dL — ABNORMAL HIGH (ref 70–99)
Glucose-Capillary: 167 mg/dL — ABNORMAL HIGH (ref 70–99)
Glucose-Capillary: 142 mg/dL — ABNORMAL HIGH (ref 70–99)

## 2019-11-11 LAB — CULTURE, BLOOD (ROUTINE X 2)
Culture: NO GROWTH
Culture: NO GROWTH

## 2019-11-11 LAB — BRAIN NATRIURETIC PEPTIDE: B Natriuretic Peptide: 31.4 pg/mL (ref 0.0–100.0)

## 2019-11-11 LAB — MAGNESIUM: Magnesium: 2.4 mg/dL (ref 1.7–2.4)

## 2019-11-11 LAB — C-REACTIVE PROTEIN: CRP: 0.9 mg/dL (ref <1.0)

## 2019-11-11 LAB — D-DIMER, QUANTITATIVE: D-Dimer, Quant: 2.62 ug/mL-FEU — ABNORMAL HIGH (ref 0.00–0.50)

## 2019-11-11 NOTE — Progress Notes (Signed)
PROGRESS NOTE                                                                                                                                                                                                             Patient Demographics:    Kevin Sanders, is a 19 y.o. male, DOB - 2000-12-11, VPX:[id number]  Outpatient Primary MD for the patient is Patient, No Pcp Per    LOS - 5  Admit date - 11/06/2019    Chief Complaint  Patient presents with  . Shortness of Breath covid pos       Brief Narrative - Kevin Sanders is a 19 y.o. male with a history of diabetes, hypertension.  Patient was seen in the ED yesterday and was diagnosed with COVID.  Started feeling ill 6 days ago, he is not vaccinated for Covid and was exposed to sick family members.  When he came to [facility] ER he was placed on 8 L oxygen, despite aggressive treatment he continued to get worse and was transferred to [facility] on 15 L of oxygen.   Subjective:   Patient in bed, appears comfortable, denies any headache, no fever, no chest pain or pressure, no shortness of breath , no abdominal pain. No focal weakness.   Assessment  & Plan :     1. Acute Hypoxic Resp. Failure due to Acute Covid 19 Viral Pneumonitis during the ongoing 2020 Covid 19 Pandemic - he is unfortunately not vaccinated and has incurred severe parenchymal lung injury due to COVID-19 pneumonia.  He has been placed on high-dose IV steroids, remdesivir and Baricitinib.  He has shown considerable improvement, if stays stable likely discharge tomorrow, will give him prophylactic Eliquis for 2 weeks and home oxygen if he qualifies.  Encouraged the patient to sit up in chair in the daytime use I-S and flutter valve for pulmonary toiletry and then prone in bed when at night.  Will advance activity and titrate down oxygen as possible.  SpO2: 93 % O2 Flow Rate (L/min): 2 L/min FiO2 (%):  100 %  Recent Labs  Lab 11/05/19 1627 11/05/19 1727 11/06/19 1353 11/06/19 1353 11/06/19 1551 11/07/19 0535 11/07/19 0536 11/08/19 0233 11/09/19 0711 11/10/19 0355 11/11/19 1240  WBC  --    < > 5.9  --   --  5.1  --  6.6 10.1 8.0  --   PLT  --    < > 156  --   --  171  --  228 244 294  --   CRP  --   --  12.6*   < >  --   --  14.7* 7.1* 2.4* 2.9* 0.9  AST  --   --  73*   < >  --  66*  --  44* 32 26 37  ALT  --   --  23   < >  --  22  --  ALKPHOS  --   --  28*   < >  --  25*  --  24* 29* 26* 28*  BILITOT  --   --  0.6   < >  --  0.6  --  0.5 0.7 0.6 0.6  ALBUMIN  --   --  3.7   < >  --  3.4*  --  3.1* 3.0* 2.9* 3.2*  DDIMER  --   --  2.30*  --   --  2.47*  --  2.94* 3.76* 2.76*  --   PROCALCITON  --   --  0.18  --   --   --   --   --   --   --   --   LATICACIDVEN  --   --  0.9  --  1.1  --   --   --   --   --   --   SARSCOV2NAA POSITIVE*  --   --   --   --   --   --   --   --   --   --    < > = values in this interval not displayed.    2.  Morbid obesity.  BMI of 41.  Follow with PCP for weight loss.  3.  Essential hypertension.  Currently on Norvasc.  4.  Moderately elevated D-dimer due to intense inflammation.  Negative CTA and leg ultrasound, D-dimer still elevated, risk of developing a blood clot remains very high due to intense inflammation, continue full dose Lovenox.  2 weeks Eliquis upon discharge.  5.  DM type II.  Continue combination of Lantus, sliding scale, and linagliptin, added premeal NovoLog for better control as well.  Lab Results  Component Value Date   HGBA1C 6.1 (A) 10/25/2019   CBG (last 3)  Recent Labs    11/10/19 2135 11/11/19 0733 11/11/19 1224  GLUCAP 237* 101* 116*      Condition - Extremely Guarded  Family Communication  : Mother Elmarie Shiley [phone] on 11/07/2019, patient now chooses to update family himself.  Code Status :  Full  Consults  :  None  Procedures  :    CTA.  No PE.    Leg ultrasound.  No DVT  PUD  Prophylaxis : PPI  Disposition Plan  :    Status is: Inpatient  Remains inpatient appropriate because:IV treatments appropriate due to intensity of illness or inability to take PO   Dispo: The patient is from: Home              Anticipated d/c is to: Home              Anticipated d/c date is: > 3 days              Patient currently is not medically stable to d/c.   DVT Prophylaxis  :  Lovenox   Lab Results  Component Value Date   PLT 294 11/10/2019    Diet :  Diet Order            Diet Carb Modified Fluid consistency: Thin; Room service appropriate? Yes  Diet effective now                  Inpatient Medications  Scheduled Meds: . albuterol  10 puff Inhalation QID  . vitamin C  500 mg Oral Daily  . baricitinib  4 mg Oral Daily  . enoxaparin (LOVENOX) injection  120 mg Subcutaneous Q12H  . insulin aspart  0-15 Units Subcutaneous TID WC  . insulin aspart  0-5 Units Subcutaneous QHS  . insulin aspart  4 Units Subcutaneous TID WC  . insulin glargine  25 Units Subcutaneous Daily  . linagliptin  5 mg Oral Daily  . methylPREDNISolone (SOLU-MEDROL) injection  40 mg Intravenous BID  . metoprolol tartrate  25 mg Oral BID  . pantoprazole  40 mg Oral Daily  . pneumococcal 23 valent vaccine  0.5 mL Intramuscular Tomorrow-1000  . zinc sulfate  220 mg Oral Daily   Continuous Infusions:  PRN Meds:.bisacodyl, chlorpheniramine-HYDROcodone, guaiFENesin-dextromethorphan, loperamide, metoprolol tartrate, [DISCONTINUED] ondansetron **OR** ondansetron (ZOFRAN) IV  Antibiotics  :    Anti-infectives (From admission, onward)   Start     Dose/Rate Route Frequency Ordered Stop   11/07/19 1000  remdesivir 100 mg in sodium chloride 0.9 % 100 mL IVPB       "Followed by" Linked Group Details   100 mg 100 mL/hr over 60 Minutes Intravenous Daily 11/06/19 1638 11/10/19 1056   11/06/19 1800  remdesivir 100 mg in sodium chloride 0.9 % 100 mL IVPB       "Followed by" Linked Group Details    100 mg 100 mL/hr over 60 Minutes Intravenous Once 11/06/19 1638 11/06/19 1940   11/06/19 1700  remdesivir 100 mg in sodium chloride 0.9 % 100 mL IVPB  Status:  Discontinued        100 mg 200 mL/hr over 30 Minutes Intravenous Once 11/06/19 1633 11/06/19 1635   11/06/19 1700  remdesivir 100 mg in sodium chloride 0.9 % 100 mL IVPB       "Followed by" Linked Group Details   100 mg 100 mL/hr over 60 Minutes Intravenous Once 11/06/19 1638 11/06/19 1819       Time Spent in minutes  30   Susa Raring M.D on 11/11/2019 at 2:23 PM  To page go to [email or web address] - password [facility]  [facility] -  Office  [phone]   See all Orders from today for further details    Objective:   Vitals:   11/11/19 0325 11/11/19 0610 11/11/19 0734 11/11/19 1208  BP: (!) 154/99  (!) 151/98 (!) 151/97  Pulse: 83 82 87 96  Resp: 18  20 20   Temp: 98.1 F (36.7 C)  97.9 F (36.6 C) 98.7 F (37.1 C)  TempSrc: Oral  Oral Oral  SpO2: 95% 95% 96% 93%  Weight:  115.7 kg    Height:        Wt Readings from Last 3 Encounters:  11/11/19 115.7 kg  11/05/19 128.4 kg  11/05/19 128 kg     Intake/Output Summary (Last 24 hours) at 11/11/2019 1423 Last data filed at 11/11/2019 0906 Gross per 24 hour  Intake 930 ml  Output 850 ml  Net 80 ml     Physical Exam  Awake Alert, No new F.N deficits, Normal  affect [place].AT,PERRAL Supple Neck,No JVD, No cervical lymphadenopathy appriciated.  Symmetrical Chest wall movement, Good air movement bilaterally, CTAB RRR,No Gallops, Rubs or new Murmurs, No Parasternal Heave +ve B.Sounds, Abd Soft, No tenderness, No organomegaly appriciated, No rebound - guarding or rigidity. No Cyanosis, Clubbing or edema, No new Rash or bruise     Data Review:    CBC Recent Labs  Lab 11/06/19 1353 11/07/19 0535 11/08/19 0233 11/09/19 0711 11/10/19 0355  WBC 5.9 5.1 6.6 10.1 8.0  HGB 15.6 15.2 15.0 14.2 14.0  HCT 47.3 46.1 45.6 44.4 43.5  PLT 156 171 228 244 294  MCV  75.2* 75.2* 74.0* 74.4* 73.9*  MCH 24.8* 24.8* 24.4* 23.8* 23.8*  MCHC 33.0 33.0 32.9 32.0 32.2  RDW 13.7 13.8 14.0 14.0 13.8  LYMPHSABS 0.8 0.6* 0.6* 0.4* 0.6*  MONOABS 0.3 0.1 0.3 0.5 0.5  EOSABS 0.0 0.0 0.0 0.0 0.0  BASOSABS 0.0 0.0 0.0 0.0 0.0    Recent Labs  Lab 11/06/19 1353 11/06/19 1353 11/06/19 1551 11/07/19 0535 11/07/19 0536 11/08/19 0233 11/09/19 0711 11/10/19 0355 11/11/19 1240 11/11/19 1244  NA 131*   < >  --  135  --  137 137 137 136  --   K 3.3*   < >  --  3.9  --  4.4 4.7 4.5 4.8  --   CL 98   < >  --  103  --  102 101 101 101  --   CO2 22   < >  --  20*  --  24 23 24 26   --   GLUCOSE 93   < >  --  114*  --  149* 155* 144* 126*  --   BUN 13   < >  --  14  --  16 20 19 20   --   CREATININE 1.20   < >  --  0.92  --  0.90 1.02 0.82 1.06  --   CALCIUM 8.2*   < >  --  8.2*  --  8.8* 8.6* 8.6* 8.9  --   AST 73*   < >  --  66*  --  44* 32 26 37  --   ALT 23   < >  --  22  --  21 18 17 25   --   ALKPHOS 28*   < >  --  25*  --  24* 29* 26* 28*  --   BILITOT 0.6   < >  --  0.6  --  0.5 0.7 0.6 0.6  --   ALBUMIN 3.7   < >  --  3.4*  --  3.1* 3.0* 2.9* 3.2*  --   MG  --   --   --  2.1  --  2.2 2.3 2.6* 2.4  --   CRP 12.6*   < >  --   --  14.7* 7.1* 2.4* 2.9* 0.9  --   DDIMER 2.30*  --   --  2.47*  --  2.94* 3.76* 2.76*  --   --   PROCALCITON 0.18  --   --   --   --   --   --   --   --   --   LATICACIDVEN 0.9  --  1.1  --   --   --   --   --   --   --   BNP  --   --   --   --   --  17.5 124.6* 42.5  --  31.4   < > = values in this interval not displayed.    ------------------------------------------------------------------------------------------------------------------ No results for input(s): CHOL, HDL, LDLCALC, TRIG, CHOLHDL, LDLDIRECT in the last 72 hours.  Lab Results  Component Value Date   HGBA1C 6.1 (A) 10/25/2019   ------------------------------------------------------------------------------------------------------------------ No results for input(s):  TSH, T4TOTAL, T3FREE, THYROIDAB in the last 72 hours.  Invalid input(s): FREET3  Cardiac Enzymes No results for input(s): CKMB, TROPONINI, MYOGLOBIN in the last 168 hours.  Invalid input(s): CK ------------------------------------------------------------------------------------------------------------------    Component Value Date/Time   BNP 31.4 11/11/2019 1244    Micro Results Recent Results (from the past 240 hour(s))  SARS Coronavirus 2 by RT PCR (hospital order, performed in [facility] hospital lab) Nasopharyngeal Nasopharyngeal Swab     Status: Abnormal   Collection Time: 11/05/19  4:27 PM   Specimen: Nasopharyngeal Swab  Result Value Ref Range Status   SARS Coronavirus 2 POSITIVE (A) NEGATIVE Final    Comment: RESULT CALLED TO, READ BACK BY AND VERIFIED WITH: J APPELT,RN @1908  11/05/19 MKELLY (NOTE) SARS-CoV-2 target nucleic acids are DETECTED  SARS-CoV-2 RNA is generally detectable in upper respiratory specimens  during the acute phase of infection.  Positive results are indicative  of the presence of the identified virus, but do not rule out bacterial infection or co-infection with other pathogens not detected by the test.  Clinical correlation with patient history and  other diagnostic information is necessary to determine patient infection status.  The expected result is negative.  Fact Sheet for Patients:   BoilerBrush.com.cy   Fact Sheet for Healthcare Providers:   https://pope.com/    This test is not yet approved or cleared by the [place] FDA and  has been authorized for detection and/or diagnosis of SARS-CoV-2 by FDA under an Emergency Use Authorization (EUA).  This EUA will remain in effect (meaning this tes t can be used) for the duration of  the COVID-19 declaration under Section 564(b)(1) of the Act, 21 U.S.C. section 360-bbb-3(b)(1), unless the authorization is terminated or revoked  sooner.  Performed at [facility], [address], [place], [place] [address]   Blood Culture (routine x 2)     Status: None   Collection Time: 11/06/19  1:56 PM   Specimen: BLOOD  Result Value Ref Range Status   Specimen Description BLOOD  Final   Special Requests NONE  Final   Culture   Final    NO GROWTH 5 DAYS Performed at [facility], [address] [place], [place] [address]    Report Status 11/11/2019 FINAL  Final  Blood Culture (routine x 2)     Status: None   Collection Time: 11/06/19  2:06 PM   Specimen: BLOOD  Result Value Ref Range Status   Specimen Description BLOOD  Final   Special Requests NONE  Final   Culture   Final    NO GROWTH 5 DAYS Performed at [facility], [address], [place], [place] [address]    Report Status 11/11/2019 FINAL  Final    Radiology Reports CT Angio Chest PE W and/or Wo Contrast  Result Date: 11/06/2019 CLINICAL DATA:  Shortness of breath, oxygen desaturation, COVID-19 positive EXAM: CT ANGIOGRAPHY CHEST WITH CONTRAST TECHNIQUE: Multidetector CT imaging of the chest was performed using the standard protocol during bolus administration of intravenous contrast. Multiplanar CT image reconstructions and MIPs were obtained to evaluate the vascular anatomy. Initial contrast opacification of the pulmonary arteries was nondiagnostic for assessment  for pulmonary embolism and patient was reinjected/repeated. CONTRAST:  Total of OMNIPAQUE IOHEXOL 350 MG/ML SOLN IV COMPARISON:  None FINDINGS: Cardiovascular: Aorta normal caliber without aneurysm or dissection. Heart unremarkable. No pericardial effusion. Pulmonary arteries adequately opacified following reinjection. Pulmonary arteries appear patent. No evidence of pulmonary embolism. Mediastinum/Nodes: Base of cervical region normal appearance. Esophagus unremarkable. No thoracic adenopathy. Lungs/Pleura: Patchy BILATERAL airspace infiltrates throughout all lobes consistent with multifocal pneumonia  and history of COVID-19. No pleural effusion or pneumothorax. Upper Abdomen: Unremarkable Musculoskeletal: No acute osseous findings. Review of the MIP images confirms the above findings. IMPRESSION: No evidence of pulmonary embolism. Patchy BILATERAL airspace infiltrates throughout all lobes consistent with multifocal pneumonia and COVID-19. Electronically Signed   By: Ulyses Southward M.D.   On: 11/06/2019 15:29   DG Chest Port 1 View  Result Date: 11/05/2019 CLINICAL DATA:  Shortness of breath, possible COVID EXAM: PORTABLE CHEST 1 VIEW COMPARISON:  07/29/2019 FINDINGS: Patchy bilateral nodular airspace disease noted. Low lung volumes. Heart is normal size. No effusions or pneumothorax. No acute bony abnormality. IMPRESSION: Patchy bilateral nodular airspace disease most compatible with COVID pneumonia. Electronically Signed   By: Charlett Nose M.D.   On: 11/05/2019 17:25   VAS [place] LOWER EXTREMITY VENOUS (DVT)  Result Date: 11/09/2019  Lower Venous DVTStudy Indications: Rapidly rising D dimer, Covid +.  Performing Technologist: Levada Schilling RDMS, RVT  Examination Guidelines: A complete evaluation includes B-mode imaging, spectral Doppler, color Doppler, and power Doppler as needed of all accessible portions of each vessel. Bilateral testing is considered an integral part of a complete examination. Limited examinations for reoccurring indications may be performed as noted. The reflux portion of the exam is performed with the patient in reverse Trendelenburg.  +---------+---------------+---------+-----------+----------+--------------+ RIGHT    CompressibilityPhasicitySpontaneityPropertiesThrombus Aging +---------+---------------+---------+-----------+----------+--------------+ CFV      Full           Yes      Yes                                 +---------+---------------+---------+-----------+----------+--------------+ SFJ      Full                                                         +---------+---------------+---------+-----------+----------+--------------+ FV Prox  Full                                                        +---------+---------------+---------+-----------+----------+--------------+ FV Mid   Full                                                        +---------+---------------+---------+-----------+----------+--------------+ FV DistalFull                                                        +---------+---------------+---------+-----------+----------+--------------+  PFV      Full                                                        +---------+---------------+---------+-----------+----------+--------------+ POP      Full           Yes      Yes                                 +---------+---------------+---------+-----------+----------+--------------+ PTV      Full                                                        +---------+---------------+---------+-----------+----------+--------------+ PERO     Full                                                        +---------+---------------+---------+-----------+----------+--------------+ GSV      Full                                                        +---------+---------------+---------+-----------+----------+--------------+   +---------+---------------+---------+-----------+----------+-------------------+ LEFT     CompressibilityPhasicitySpontaneityPropertiesThrombus Aging      +---------+---------------+---------+-----------+----------+-------------------+ CFV      Full           Yes      Yes                                      +---------+---------------+---------+-----------+----------+-------------------+ SFJ      Full                                                             +---------+---------------+---------+-----------+----------+-------------------+ FV Prox  Full                                                              +---------+---------------+---------+-----------+----------+-------------------+ FV Mid   Full                                                             +---------+---------------+---------+-----------+----------+-------------------+ FV DistalFull                                                             +---------+---------------+---------+-----------+----------+-------------------+  PFV      Full                                                             +---------+---------------+---------+-----------+----------+-------------------+ POP      Full           Yes      Yes                                      +---------+---------------+---------+-----------+----------+-------------------+ PTV      Full                                                             +---------+---------------+---------+-----------+----------+-------------------+ PERO     Full                                         not well visualized +---------+---------------+---------+-----------+----------+-------------------+ GSV      Full                                                             +---------+---------------+---------+-----------+----------+-------------------+     Summary: RIGHT: - There is no evidence of deep vein thrombosis in the lower extremity.  - No cystic structure found in the popliteal fossa.  LEFT: - There is no evidence of deep vein thrombosis in the lower extremity. However, portions of this examination were limited- see technologist comments above.  - No cystic structure found in the popliteal fossa.  *See table(s) above for measurements and observations. Electronically signed by Coral ElseVance Brabham MD on 11/09/2019 at 8:49:28 PM.    Final    VAS US LOWER EXTREMITY VENOUS (DVT)  Result Date: 11/07/2019  Lower Venous DVTStudy Indications: Elevated d dimer.  Comparison Study: No prior studies. Performing Technologist: Jean Rosenthalachel Hodge  Examination Guidelines: A complete  evaluation includes B-mode imaging, spectral Doppler, color Doppler, and power Doppler as needed of all accessible portions of each vessel. Bilateral testing is considered an integral part of a complete examination. Limited examinations for reoccurring indications may be performed as noted. The reflux portion of the exam is performed with the patient in reverse Trendelenburg.  +---------+---------------+---------+-----------+----------+--------------+ RIGHT    CompressibilityPhasicitySpontaneityPropertiesThrombus Aging +---------+---------------+---------+-----------+----------+--------------+ CFV      Full           Yes      Yes                                 +---------+---------------+---------+-----------+----------+--------------+ SFJ      Full                                                        +---------+---------------+---------+-----------+----------+--------------+  FV Prox  Full                                                        +---------+---------------+---------+-----------+----------+--------------+ FV Mid   Full                                                        +---------+---------------+---------+-----------+----------+--------------+ FV DistalFull                                                        +---------+---------------+---------+-----------+----------+--------------+ PFV      Full                                                        +---------+---------------+---------+-----------+----------+--------------+ POP      Full           Yes      Yes                                 +---------+---------------+---------+-----------+----------+--------------+ PTV      Full                                                        +---------+---------------+---------+-----------+----------+--------------+ PERO     Full                                                         +---------+---------------+---------+-----------+----------+--------------+   +---------+---------------+---------+-----------+----------+--------------+ LEFT     CompressibilityPhasicitySpontaneityPropertiesThrombus Aging +---------+---------------+---------+-----------+----------+--------------+ CFV      Full           Yes      Yes                                 +---------+---------------+---------+-----------+----------+--------------+ SFJ      Full                                                        +---------+---------------+---------+-----------+----------+--------------+ FV Prox  Full                                                        +---------+---------------+---------+-----------+----------+--------------+  FV Mid   Full                                                        +---------+---------------+---------+-----------+----------+--------------+ FV DistalFull                                                        +---------+---------------+---------+-----------+----------+--------------+ PFV      Full                                                        +---------+---------------+---------+-----------+----------+--------------+ POP      Full           Yes      Yes                                 +---------+---------------+---------+-----------+----------+--------------+ PTV      Full                                                        +---------+---------------+---------+-----------+----------+--------------+ PERO     Full                                                        +---------+---------------+---------+-----------+----------+--------------+     Summary: RIGHT: - There is no evidence of deep vein thrombosis in the lower extremity.  - No cystic structure found in the popliteal fossa.  LEFT: - There is no evidence of deep vein thrombosis in the lower extremity.  - No cystic structure found in the popliteal fossa.   *See table(s) above for measurements and observations. Electronically signed by Lemar Livings MD on 11/07/2019 at 7:41:34 PM.    Final

## 2019-11-11 NOTE — Progress Notes (Signed)
SATURATION QUALIFICATIONS: (This note is used to comply with regulatory documentation for home oxygen)  Patient Saturations on Room Air at Rest = 99%  Patient Saturations on Room Air while Ambulating = 83%  Patient Saturations on 2 Liters of oxygen while Ambulating = 90%  Please briefly explain why patient needs home oxygen:

## 2019-11-11 NOTE — Progress Notes (Addendum)
Occupational Therapy Treatment Patient Details Name: Kevin Sanders MRN: [id number] DOB: [birth year] Today's Date: 11/11/2019    History of present illness Pt is a 19 y/o male with PMHx including DM, HTN. Pt diagnosed with Covid, initially presented to [facility] and requiring 8LO2, however continued to have worsening respiratory status and was transferred to [facility]. Admitted with acute hypoxic respiratory failure d/t Covid 19 viral pneumonitis.    OT comments  Pt progressing towards established OT goals. Pt eager to participate in therapy and mobility. Providing education and pt participating in BUEs exercises with red theraband; 10x each. Pt performing gentle squats with rest breaks; x2. Pt performing functional mobility in hallway with supervision and cues for purse lip breathing; rest breaks required. Providing education and handout on energy conservation and pt verbalized understanding. Notified the RN that pt would benefit from additional walk this afternoon. Continue to recommend dc to home once medically stable per phsyician. Will follow up acutely as admitted.   SpO2 >88% on RA with standing rest breaks and cues for purse lip breathing    Follow Up Recommendations  No OT follow up;Supervision - Intermittent    Equipment Recommendations  None recommended by OT    Recommendations for Other Services      Precautions / Restrictions Precautions Precautions: None Precaution Comments: watch O2       Mobility Bed Mobility               General bed mobility comments: Pt up in chair  Transfers Overall transfer level: Independent                    Balance Overall balance assessment: Independent                                         ADL either performed or assessed with clinical judgement   ADL Overall ADL's : Modified independent                                       General ADL Comments: Providing education on [facility] for ADLs  and IADLs and reviewed in full. Pt verbalized understanding.      Vision       Perception     Praxis      Cognition Arousal/Alertness: Awake/alert Behavior During Therapy: WFL for tasks assessed/performed Overall Cognitive Status: Within Functional Limits for tasks assessed                                          Exercises Exercises: General Upper Extremity;Other exercises General Exercises - Upper Extremity Shoulder ABduction: Strengthening;10 reps;Seated;Theraband Theraband Level (Shoulder Abduction): Level 2 (Red) Shoulder ADduction: Strengthening;10 reps;Seated;Theraband Theraband Level (Shoulder Adduction): Level 2 (Red) Shoulder Horizontal ABduction: Strengthening;10 reps;Seated;Theraband Theraband Level (Shoulder Horizontal Abduction): Level 2 (Red) Shoulder Horizontal ADduction: Strengthening;10 reps;Seated;Theraband Theraband Level (Shoulder Horizontal Adduction): Level 2 (Red) Elbow Flexion: Strengthening;10 reps;Seated;Theraband Theraband Level (Elbow Flexion): Level 2 (Red) Elbow Extension: Strengthening;10 reps;Seated;Theraband Theraband Level (Elbow Extension): Level 2 (Red) Other Exercises Other Exercises: Five squats. Standing rest break. x2   Shoulder Instructions       General Comments SpO2 >88% on RA.     Pertinent Vitals/ Pain  Pain Assessment: No/denies pain  Home Living                                          Prior Functioning/Environment              Frequency  Min 2X/week        Progress Toward Goals  OT Goals(current goals can now be found in the care plan section)  Progress towards OT goals: Progressing toward goals  Acute Rehab OT Goals Patient Stated Goal: return to his PLOF OT Goal Formulation: With patient Time For Goal Achievement: 11/22/19 Potential to Achieve Goals: Good ADL Goals Pt Will Perform Grooming: with modified independence;standing Pt Will Perform Lower Body  Bathing: with modified independence;sit to/from stand Pt Will Perform Lower Body Dressing: with modified independence;sit to/from stand Pt Will Transfer to Toilet: with modified independence;ambulating Pt Will Perform Toileting - Clothing Manipulation and hygiene: with modified independence;sit to/from stand Pt/caregiver will Perform Home Exercise Program: Increased strength;Both right and left upper extremity;With written HEP provided;With theraband;Independently Additional ADL Goal #1: Pt will independently demonstrate ability to self-monitor and perform pursed lip breathing PRN during functional tasks.  Plan Discharge plan remains appropriate    Co-evaluation                 AM-PAC OT "6 Clicks" Daily Activity     Outcome Measure   Help from another person eating meals?: None Help from another person taking care of personal grooming?: None Help from another person toileting, which includes using toliet, bedpan, or urinal?: None Help from another person bathing (including washing, rinsing, drying)?: None Help from another person to put on and taking off regular upper body clothing?: None Help from another person to put on and taking off regular lower body clothing?: None 6 Click Score: 24    End of Session    OT Visit Diagnosis: Other abnormalities of gait and mobility (R26.89);Other (comment) (decreased cardiorespiratory status/activity tolerance )   Activity Tolerance Patient tolerated treatment well   Patient Left in chair;with call bell/phone within reach   Nurse Communication Mobility status        Time: 1205-1228 OT Time Calculation (min): 23 min  Charges: OT General Charges $OT Visit: 1 Visit OT Treatments $Self Care/Home Management : 8-22 mins $Therapeutic Exercise: 8-22 mins  Kevin Sanders MSOT, OTR/L Acute Rehab Pager: [phone] Office: [phone]   Kevin Sanders 11/11/2019, 1:42 PM

## 2019-11-12 LAB — CBC
HCT: 49.6 % (ref 39.0–52.0)
Hemoglobin: 16 g/dL (ref 13.0–17.0)
MCH: 23.9 pg — ABNORMAL LOW (ref 26.0–34.0)
MCHC: 32.3 g/dL (ref 30.0–36.0)
MCV: 74 fL — ABNORMAL LOW (ref 80.0–100.0)
Platelets: 443 10*3/uL — ABNORMAL HIGH (ref 150–400)
RBC: 6.7 MIL/uL — ABNORMAL HIGH (ref 4.22–5.81)
RDW: 14.8 % (ref 11.5–15.5)
WBC: 9.3 10*3/uL (ref 4.0–10.5)
nRBC: 0 % (ref 0.0–0.2)

## 2019-11-12 LAB — GLUCOSE, CAPILLARY
Glucose-Capillary: 113 mg/dL — ABNORMAL HIGH (ref 70–99)
Glucose-Capillary: 133 mg/dL — ABNORMAL HIGH (ref 70–99)
Glucose-Capillary: 167 mg/dL — ABNORMAL HIGH (ref 70–99)

## 2019-11-12 LAB — D-DIMER, QUANTITATIVE: D-Dimer, Quant: 1.99 ug/mL-FEU — ABNORMAL HIGH (ref 0.00–0.50)

## 2019-11-12 MED ORDER — LOSARTAN POTASSIUM 50 MG PO TABS
50.0000 mg | ORAL_TABLET | Freq: Every day | ORAL | Status: DC
  Administered 2019-11-12: 50 mg via ORAL
  Filled 2019-11-12: qty 1

## 2019-11-12 MED ORDER — APIXABAN 2.5 MG PO TABS: 2.5000 mg | ORAL_TABLET | Freq: Two times a day (BID) | ORAL | 0 refills | Status: DC

## 2019-11-12 MED ORDER — ALBUTEROL SULFATE HFA 108 (90 BASE) MCG/ACT IN AERS
2.0000 | INHALATION_SPRAY | Freq: Four times a day (QID) | RESPIRATORY_TRACT | 0 refills | Status: DC | PRN
Start: 2019-11-12 — End: 2023-09-01

## 2019-11-12 MED ORDER — AMLODIPINE BESYLATE 10 MG PO TABS
10.0000 mg | ORAL_TABLET | Freq: Every day | ORAL | Status: DC
  Administered 2019-11-12: 10 mg via ORAL
  Filled 2019-11-12: qty 1

## 2019-11-12 NOTE — Discharge Instructions (Signed)
Follow with Primary MD in 7 days   Get CBC, CMP, 2 view Chest X ray -  checked next visit within 1 week by Primary MD   Activity: As tolerated with Full fall precautions use walker/cane & assistance as needed  Disposition Home    Diet: Heart Healthy Low carb  Special Instructions: If you have smoked or chewed Tobacco  in the last 2 yrs please stop smoking, stop any regular Alcohol  and or any Recreational drug use.  On your next visit with your primary care physician please Get Medicines reviewed and adjusted.  Please request your Prim.MD to go over all Hospital Tests and Procedure/Radiological results at the follow up, please get all Hospital records sent to your Prim MD by signing hospital release before you go home.  If you experience worsening of your admission symptoms, develop shortness of breath, life threatening emergency, suicidal or homicidal thoughts you must seek medical attention immediately by calling 911 or calling your MD immediately  if symptoms less severe.  You Must read complete instructions/literature along with all the possible adverse reactions/side effects for all the Medicines you take and that have been prescribed to you. Take any new Medicines after you have completely understood and accpet all the possible adverse reactions/side effects.         Person Under Monitoring Name: Kevin Sanders  Location: [address] [place] [address]   Infection Prevention Recommendations for Individuals Confirmed to have, or Being Evaluated for, 2019 Novel Coronavirus (COVID-19) Infection Who Receive Care at Home  Individuals who are confirmed to have, or are being evaluated for, COVID-19 should follow the prevention steps below until a healthcare provider or local or state health department says they can return to normal activities.  Stay home except to get medical care You should restrict activities outside your home, except for getting medical care.  Do not go to work, school, or public areas, and do not use public transportation or taxis.  Call ahead before visiting your doctor Before your medical appointment, call the healthcare provider and tell them that you have, or are being evaluated for, COVID-19 infection. This will help the healthcare provider's office take steps to keep other people from getting infected. Ask your healthcare provider to call the local or state health department.  Monitor your symptoms Seek prompt medical attention if your illness is worsening (e.g., difficulty breathing). Before going to your medical appointment, call the healthcare provider and tell them that you have, or are being evaluated for, COVID-19 infection. Ask your healthcare provider to call the local or state health department.  Wear a facemask You should wear a facemask that covers your nose and mouth when you are in the same room with other people and when you visit a healthcare provider. People who live with or visit you should also wear a facemask while they are in the same room with you.  Separate yourself from other people in your home As much as possible, you should stay in a different room from other people in your home. Also, you should use a separate bathroom, if available.  Avoid sharing household items You should not share dishes, drinking glasses, cups, eating utensils, towels, bedding, or other items with other people in your home. After using these items, you should wash them thoroughly with soap and water.  Cover your coughs and sneezes Cover your mouth and nose with a tissue when you cough or sneeze, or you can cough or  sneeze into your sleeve. Throw used tissues in a lined trash can, and immediately wash your hands with soap and water for at least 20 seconds or use an alcohol-based hand rub.  Wash your [facility] your hands often and thoroughly with soap and water for at least 20 seconds. You can use an alcohol-based  hand sanitizer if soap and water are not available and if your hands are not visibly dirty. Avoid touching your eyes, nose, and mouth with unwashed hands.   Prevention Steps for Caregivers and Household Members of Individuals Confirmed to have, or Being Evaluated for, COVID-19 Infection Being Cared for in the Home  If you live with, or provide care at home for, a person confirmed to have, or being evaluated for, COVID-19 infection please follow these guidelines to prevent infection:  Follow healthcare provider's instructions Make sure that you understand and can help the patient follow any healthcare provider instructions for all care.  Provide for the patient's basic needs You should help the patient with basic needs in the home and provide support for getting groceries, prescriptions, and other personal needs.  Monitor the patient's symptoms If they are getting sicker, call his or her medical provider and tell them that the patient has, or is being evaluated for, COVID-19 infection. This will help the healthcare provider's office take steps to keep other people from getting infected. Ask the healthcare provider to call the local or state health department.  Limit the number of people who have contact with the patient  If possible, have only one caregiver for the patient.  Other household members should stay in another home or place of residence. If this is not possible, they should stay  in another room, or be separated from the patient as much as possible. Use a separate bathroom, if available.  Restrict visitors who do not have an essential need to be in the home.  Keep older adults, very young children, and other sick people away from the patient Keep older adults, very young children, and those who have compromised immune systems or chronic health conditions away from the patient. This includes people with chronic heart, lung, or kidney conditions, diabetes, and  cancer.  Ensure good ventilation Make sure that shared spaces in the home have good air flow, such as from an air conditioner or an opened window, weather permitting.  Wash your hands often  Wash your hands often and thoroughly with soap and water for at least 20 seconds. You can use an alcohol based hand sanitizer if soap and water are not available and if your hands are not visibly dirty.  Avoid touching your eyes, nose, and mouth with unwashed hands.  Use disposable paper towels to dry your hands. If not available, use dedicated cloth towels and replace them when they become wet.  Wear a facemask and gloves  Wear a disposable facemask at all times in the room and gloves when you touch or have contact with the patient's blood, body fluids, and/or secretions or excretions, such as sweat, saliva, sputum, nasal mucus, vomit, urine, or feces.  Ensure the mask fits over your nose and mouth tightly, and do not touch it during use.  Throw out disposable facemasks and gloves after using them. Do not reuse.  Wash your hands immediately after removing your facemask and gloves.  If your personal clothing becomes contaminated, carefully remove clothing and launder. Wash your hands after handling contaminated clothing.  Place all used disposable facemasks, gloves, and other waste  in a lined container before disposing them with other household waste.  Remove gloves and wash your hands immediately after handling these items.  Do not share dishes, glasses, or other household items with the patient  Avoid sharing household items. You should not share dishes, drinking glasses, cups, eating utensils, towels, bedding, or other items with a patient who is confirmed to have, or being evaluated for, COVID-19 infection.  After the person uses these items, you should wash them thoroughly with soap and water.  Wash laundry thoroughly  Immediately remove and wash clothes or bedding that have blood, body  fluids, and/or secretions or excretions, such as sweat, saliva, sputum, nasal mucus, vomit, urine, or feces, on them.  Wear gloves when handling laundry from the patient.  Read and follow directions on labels of laundry or clothing items and detergent. In general, wash and dry with the warmest temperatures recommended on the label.  Clean all areas the individual has used often  Clean all touchable surfaces, such as counters, tabletops, doorknobs, bathroom fixtures, toilets, phones, keyboards, tablets, and bedside tables, every day. Also, clean any surfaces that may have blood, body fluids, and/or secretions or excretions on them.  Wear gloves when cleaning surfaces the patient has come in contact with.  Use a diluted bleach solution (e.g., dilute bleach with 1 part bleach and 10 parts water) or a household disinfectant with a label that says EPA-registered for coronaviruses. To make a bleach solution at home, add 1 tablespoon of bleach to 1 quart (4 cups) of water. For a larger supply, add  cup of bleach to 1 gallon (16 cups) of water.  Read labels of cleaning products and follow recommendations provided on product labels. Labels contain instructions for safe and effective use of the cleaning product including precautions you should take when applying the product, such as wearing gloves or eye protection and making sure you have good ventilation during use of the product.  Remove gloves and wash hands immediately after cleaning.  Monitor yourself for signs and symptoms of illness Caregivers and household members are considered close contacts, should monitor their health, and will be asked to limit movement outside of the home to the extent possible. Follow the monitoring steps for close contacts listed on the symptom monitoring form.   ? If you have additional questions, contact your local health department or call the epidemiologist on call at [phone] (available 24/7). ? This  guidance is subject to change. For the most up-to-date guidance from [facility], please refer to their website: [employer]Met[employer].hu  =================================================================================================  Information on my medicine - ELIQUIS (apixaban)  This medication education was reviewed with me or my healthcare representative as part of my discharge preparation.    Why was Eliquis prescribed for you? Eliquis was prescribed for you to reduce the risk of blood clots forming after COVID-19 infection.    What do You need to know about Eliquis? Take your Eliquis 2.5 mg TWICE DAILY - one tablet in the morning and one tablet in the evening with or without food.  It would be best to take the dose about the same time each day.  If you have difficulty swallowing the tablet whole please discuss with your pharmacist how to take the medication safely.  Take Eliquis exactly as prescribed by your doctor and DO NOT stop taking Eliquis without talking to the doctor who prescribed the medication.  Stopping without other medication to take the place of Eliquis may increase your risk of developing a clot.  After discharge, you should have regular check-up appointments with your healthcare provider that is prescribing your Eliquis.  What do you do if you miss a dose? If a dose of ELIQUIS is not taken at the scheduled time, take it as soon as possible on the same day and twice-daily administration should be resumed.  The dose should not be doubled to make up for a missed dose.  Do not take more than one tablet of ELIQUIS at the same time.  Important Safety Information A possible side effect of Eliquis is bleeding. You should call your healthcare provider right away if you experience any of the following: ? Bleeding from an injury or your nose that does not stop. ? Unusual colored urine (red or dark [place]) or unusual  colored stools (red or black). ? Unusual bruising for unknown reasons. ? A serious fall or if you hit your head (even if there is no bleeding).  Some medicines may interact with Eliquis and might increase your risk of bleeding or clotting while on Eliquis. To help avoid this, consult your healthcare provider or pharmacist prior to using any new prescription or non-prescription medications, including herbals, vitamins, non-steroidal anti-inflammatory drugs (NSAIDs) and supplements.  This website has more information on Eliquis (apixaban): http://www.eliquis.com/eliquis/home

## 2019-11-12 NOTE — Progress Notes (Addendum)
Patient was discharged home by MD order; discharged instructions review and give to patient with care notes; Eliquis co-pay card was delivered to patient; IV DIC; skin intact. Patient left at 16:49 o'clock without oxygen. CM was notified that oxygen tank wasn't delivered and per [employer] they are still working on that order. Patient verbalized that his ride was waiting for him since 12:00 o'clock and the ride had to be in class by 5 PM, therefore he must leave because nobody else can come to pick him up. When staff tried to explained to him that he will need oxygen with activities, he verbalized that he will not use it any way.

## 2019-11-12 NOTE — Discharge Summary (Signed)
Kevin Sanders OFB:[id number] DOB: April 02, 2000 DOA: 11/06/2019  PCP: Patient, No Pcp Per  Admit date: 11/06/2019  Discharge date: 11/12/2019  Admitted From: Home   Disposition:  Home   Recommendations for Outpatient Follow-up:   Follow up with PCP in 1-2 weeks  PCP Please obtain BMP/CBC, 2 view CXR in 1week,  (see Discharge instructions)   PCP Please follow up on the following pending results:    Home Health: None   Equipment/Devices: 2lit o2 PRB  Consultations: None  Discharge Condition: Stable    CODE STATUS: Full    Diet Recommendation: Heart Healthy Low carb  Diet Order            Diet Carb Modified Fluid consistency: Thin; Room service appropriate? Yes  Diet effective now                  Chief Complaint  Patient presents with  . Shortness of Breath covid pos     Brief history of present illness from the day of admission and additional interim summary    Kevin J Brownis a 19 y.o.malewith a history of diabetes, hypertension. Patient was seen in the ED yesterday and was diagnosed with COVID.Started feeling ill 6 days ago, he is not vaccinated for Covid and was exposed to sick family members.  When he came to [facility], ER he was placed on 8 L oxygen, despite aggressive treatment he continued to get worse and was transferred to [facility] on 15 L of oxygen.                                                                 Hospital Course   1. Acute Hypoxic Resp. Failure due to Acute Covid 19 Viral Pneumonitis during the ongoing 2020 Covid 19 Pandemic - he is unfortunately not vaccinated and has incurred severe parenchymal lung injury due to COVID-19 pneumonia.  He has been placed on high-dose IV steroids, remdesivir and Baricitinib.  He has shown considerable improvement, if stays  stable likely discharge tomorrow, will give him prophylactic Eliquis for 2 weeks and home oxygen if he qualifies.   SpO2: 95 % O2 Flow Rate (L/min): 2 L/min FiO2 (%): 100 %  Recent Labs  Lab  0000 11/05/19 1627 11/06/19 1353 11/07/19 0535 11/07/19 0536 11/08/19 0233 11/09/19 0711 11/10/19 0355 11/11/19 1240 11/11/19 1244 11/12/19 0112  CRP   < >  --  12.6*  --  14.7* 7.1* 2.4* 2.9* 0.9  --   --   DDIMER  --   --  2.30*   < >  --  2.94* 3.76* 2.76* 2.62*  --  1.99*  FERRITIN  --   --  650*  --   --   --   --   --   --   --   --  BNP  --   --   --   --   --  17.5 124.6* 42.5  --  31.4  --   PROCALCITON  --   --  0.18  --   --   --   --   --   --   --   --   SARSCOV2NAA  --  POSITIVE*  --   --   --   --   --   --   --   --   --    < > = values in this interval not displayed.    2.  Morbid obesity.  BMI of 41.  Follow with PCP for weight loss.  3.  Essential hypertension.  Resumed home regimen.  4.  Moderately elevated D-dimer due to intense inflammation.  Negative CTA and leg ultrasound, D-dimer still elevated, risk of developing a blood clot remains very high due to intense inflammation, kept here on full dose Lovenox with downtrending D-dimer.  2 weeks Eliquis upon discharge.  5.  DM type II.  Continue home regimen upon discharge.  Lab Results  Component Value Date   HGBA1C 6.1 (A) 10/25/2019     Discharge diagnosis     Principal Problem:   Acute respiratory failure with hypoxia (HCC) Active Problems:   Essential hypertension   Hypertriglyceridemia   Type 2 diabetes mellitus with microalbuminuria, without long-term current use of insulin (HCC)   Pneumonia due to COVID-19 virus    Discharge instructions    Discharge Instructions    Discharge instructions   Complete by: As directed    Follow with Primary MD in 7 days   Get CBC, CMP, 2 view Chest X ray -  checked next visit within 1 week by Primary MD   Activity: As tolerated with Full fall  precautions use walker/cane & assistance as needed  Disposition Home    Diet: Heart Healthy Low carb  Special Instructions: If you have smoked or chewed Tobacco  in the last 2 yrs please stop smoking, stop any regular Alcohol  and or any Recreational drug use.  On your next visit with your primary care physician please Get Medicines reviewed and adjusted.  Please request your Prim.MD to go over all Hospital Tests and Procedure/Radiological results at the follow up, please get all Hospital records sent to your Prim MD by signing hospital release before you go home.  If you experience worsening of your admission symptoms, develop shortness of breath, life threatening emergency, suicidal or homicidal thoughts you must seek medical attention immediately by calling 911 or calling your MD immediately  if symptoms less severe.  You Must read complete instructions/literature along with all the possible adverse reactions/side effects for all the Medicines you take and that have been prescribed to you. Take any new Medicines after you have completely understood and accpet all the possible adverse reactions/side effects.   Increase activity slowly   Complete by: As directed    MyChart COVID-19 home monitoring program   Complete by: Nov 12, 2019    Is the patient willing to use the [facility] for home monitoring?: Yes      Discharge Medications   Allergies as of 11/12/2019      Reactions   Augmentin [amoxicillin-pot Clavulanate]       Medication List    TAKE these medications   acetaminophen 500 MG tablet Commonly known as: TYLENOL Take 1,000 mg by mouth every 6 (six) hours as needed.  albuterol 108 (90 Base) MCG/ACT inhaler Commonly known as: VENTOLIN HFA Inhale 2 puffs into the lungs every 6 (six) hours as needed for wheezing or shortness of breath.   amLODipine 10 MG tablet Commonly known as: NORVASC Take 1 tablet (10 mg total) by mouth daily.   apixaban 2.5 MG Tabs  tablet Commonly known as: Eliquis Take 1 tablet (2.5 mg total) by mouth 2 (two) times daily.   blood glucose meter kit and supplies Kit Dispense based on patient and insurance preference. Use up to four times daily as directed. (FOR ICD-9 250.00, 250.01).   Insulin Pen Needle 32G X 4 MM Misc 1 Units by Does not apply route 4 (four) times daily -  before meals and at bedtime.   Lantus SoloStar 100 UNIT/ML Solostar Pen Generic drug: insulin glargine Inject 70 Units into the skin daily.   losartan 50 MG tablet Commonly known as: COZAAR Take 1 tablet (50 mg total) by mouth daily.   metFORMIN 500 MG 24 hr tablet Commonly known as: GLUCOPHAGE-XR Take 2 tablets (1,000 mg total) by mouth in the morning and at bedtime.        Follow-up Information    [place]. Schedule an appointment as soon as possible for a visit in 1 week(s).   Contact information: [address] [id number] [phone]              Major procedures and Radiology Reports - PLEASE review detailed and final reports thoroughly  -       CT Angio Chest PE W and/or Wo Contrast  Result Date: 11/06/2019 CLINICAL DATA:  Shortness of breath, oxygen desaturation, COVID-19 positive EXAM: CT ANGIOGRAPHY CHEST WITH CONTRAST TECHNIQUE: Multidetector CT imaging of the chest was performed using the standard protocol during bolus administration of intravenous contrast. Multiplanar CT image reconstructions and MIPs were obtained to evaluate the vascular anatomy. Initial contrast opacification of the pulmonary arteries was nondiagnostic for assessment for pulmonary embolism and patient was reinjected/repeated. CONTRAST:  Total of 171mL OMNIPAQUE IOHEXOL 350 MG/ML SOLN IV COMPARISON:  None FINDINGS: Cardiovascular: Aorta normal caliber without aneurysm or dissection. Heart unremarkable. No pericardial effusion. Pulmonary arteries adequately opacified following  reinjection. Pulmonary arteries appear patent. No evidence of pulmonary embolism. Mediastinum/Nodes: Base of cervical region normal appearance. Esophagus unremarkable. No thoracic adenopathy. Lungs/Pleura: Patchy BILATERAL airspace infiltrates throughout all lobes consistent with multifocal pneumonia and history of COVID-19. No pleural effusion or pneumothorax. Upper Abdomen: Unremarkable Musculoskeletal: No acute osseous findings. Review of the MIP images confirms the above findings. IMPRESSION: No evidence of pulmonary embolism. Patchy BILATERAL airspace infiltrates throughout all lobes consistent with multifocal pneumonia and COVID-19. Electronically Signed   By: Lavonia Dana M.D.   On: 11/06/2019 15:29   DG Chest Port 1 View  Result Date: 11/05/2019 CLINICAL DATA:  Shortness of breath, possible COVID EXAM: PORTABLE CHEST 1 VIEW COMPARISON:  07/29/2019 FINDINGS: Patchy bilateral nodular airspace disease noted. Low lung volumes. Heart is normal size. No effusions or pneumothorax. No acute bony abnormality. IMPRESSION: Patchy bilateral nodular airspace disease most compatible with COVID pneumonia. Electronically Signed   By: Rolm Baptise M.D.   On: 11/05/2019 17:25   VAS [place] LOWER EXTREMITY VENOUS (DVT)  Result Date: 11/09/2019  Lower Venous DVTStudy Indications: Rapidly rising D dimer, Covid +.  Performing Technologist: Antonieta Pert RDMS, RVT  Examination Guidelines: A complete evaluation includes B-mode imaging, spectral Doppler, color Doppler, and power Doppler as needed of all accessible  portions of each vessel. Bilateral testing is considered an integral part of a complete examination. Limited examinations for reoccurring indications may be performed as noted. The reflux portion of the exam is performed with the patient in reverse Trendelenburg.  +---------+---------------+---------+-----------+----------+--------------+ RIGHT    CompressibilityPhasicitySpontaneityPropertiesThrombus Aging  +---------+---------------+---------+-----------+----------+--------------+ CFV      Full           Yes      Yes                                 +---------+---------------+---------+-----------+----------+--------------+ SFJ      Full                                                        +---------+---------------+---------+-----------+----------+--------------+ FV Prox  Full                                                        +---------+---------------+---------+-----------+----------+--------------+ FV Mid   Full                                                        +---------+---------------+---------+-----------+----------+--------------+ FV DistalFull                                                        +---------+---------------+---------+-----------+----------+--------------+ PFV      Full                                                        +---------+---------------+---------+-----------+----------+--------------+ POP      Full           Yes      Yes                                 +---------+---------------+---------+-----------+----------+--------------+ PTV      Full                                                        +---------+---------------+---------+-----------+----------+--------------+ PERO     Full                                                        +---------+---------------+---------+-----------+----------+--------------+ GSV      Full                                                        +---------+---------------+---------+-----------+----------+--------------+   +---------+---------------+---------+-----------+----------+-------------------+  LEFT     CompressibilityPhasicitySpontaneityPropertiesThrombus Aging      +---------+---------------+---------+-----------+----------+-------------------+ CFV      Full           Yes      Yes                                       +---------+---------------+---------+-----------+----------+-------------------+ SFJ      Full                                                             +---------+---------------+---------+-----------+----------+-------------------+ FV Prox  Full                                                             +---------+---------------+---------+-----------+----------+-------------------+ FV Mid   Full                                                             +---------+---------------+---------+-----------+----------+-------------------+ FV DistalFull                                                             +---------+---------------+---------+-----------+----------+-------------------+ PFV      Full                                                             +---------+---------------+---------+-----------+----------+-------------------+ POP      Full           Yes      Yes                                      +---------+---------------+---------+-----------+----------+-------------------+ PTV      Full                                                             +---------+---------------+---------+-----------+----------+-------------------+ PERO     Full                                         not well visualized +---------+---------------+---------+-----------+----------+-------------------+ GSV      Full                                                             +---------+---------------+---------+-----------+----------+-------------------+  Summary: RIGHT: - There is no evidence of deep vein thrombosis in the lower extremity.  - No cystic structure found in the popliteal fossa.  LEFT: - There is no evidence of deep vein thrombosis in the lower extremity. However, portions of this examination were limited- see technologist comments above.  - No cystic structure found in the popliteal fossa.  *See table(s) above for measurements and  observations. Electronically signed by Harold Barban MD on 11/09/2019 at 8:49:28 PM.    Final    VAS [place] LOWER EXTREMITY VENOUS (DVT)  Result Date: 11/07/2019  Lower Venous DVTStudy Indications: Elevated d dimer.  Comparison Study: No prior studies. Performing Technologist: Darlin Coco  Examination Guidelines: A complete evaluation includes B-mode imaging, spectral Doppler, color Doppler, and power Doppler as needed of all accessible portions of each vessel. Bilateral testing is considered an integral part of a complete examination. Limited examinations for reoccurring indications may be performed as noted. The reflux portion of the exam is performed with the patient in reverse Trendelenburg.  +---------+---------------+---------+-----------+----------+--------------+ RIGHT    CompressibilityPhasicitySpontaneityPropertiesThrombus Aging +---------+---------------+---------+-----------+----------+--------------+ CFV      Full           Yes      Yes                                 +---------+---------------+---------+-----------+----------+--------------+ SFJ      Full                                                        +---------+---------------+---------+-----------+----------+--------------+ FV Prox  Full                                                        +---------+---------------+---------+-----------+----------+--------------+ FV Mid   Full                                                        +---------+---------------+---------+-----------+----------+--------------+ FV DistalFull                                                        +---------+---------------+---------+-----------+----------+--------------+ PFV      Full                                                        +---------+---------------+---------+-----------+----------+--------------+ POP      Full           Yes      Yes                                  +---------+---------------+---------+-----------+----------+--------------+  PTV      Full                                                        +---------+---------------+---------+-----------+----------+--------------+ PERO     Full                                                        +---------+---------------+---------+-----------+----------+--------------+   +---------+---------------+---------+-----------+----------+--------------+ LEFT     CompressibilityPhasicitySpontaneityPropertiesThrombus Aging +---------+---------------+---------+-----------+----------+--------------+ CFV      Full           Yes      Yes                                 +---------+---------------+---------+-----------+----------+--------------+ SFJ      Full                                                        +---------+---------------+---------+-----------+----------+--------------+ FV Prox  Full                                                        +---------+---------------+---------+-----------+----------+--------------+ FV Mid   Full                                                        +---------+---------------+---------+-----------+----------+--------------+ FV DistalFull                                                        +---------+---------------+---------+-----------+----------+--------------+ PFV      Full                                                        +---------+---------------+---------+-----------+----------+--------------+ POP      Full           Yes      Yes                                 +---------+---------------+---------+-----------+----------+--------------+ PTV      Full                                                        +---------+---------------+---------+-----------+----------+--------------+  PERO     Full                                                         +---------+---------------+---------+-----------+----------+--------------+     Summary: RIGHT: - There is no evidence of deep vein thrombosis in the lower extremity.  - No cystic structure found in the popliteal fossa.  LEFT: - There is no evidence of deep vein thrombosis in the lower extremity.  - No cystic structure found in the popliteal fossa.  *See table(s) above for measurements and observations. Electronically signed by Servando Snare MD on 11/07/2019 at 7:41:34 PM.    Final     Micro Results     Recent Results (from the past 240 hour(s))  SARS Coronavirus 2 by RT PCR (hospital order, performed in [facility] hospital lab) Nasopharyngeal Nasopharyngeal Swab     Status: Abnormal   Collection Time: 11/05/19  4:27 PM   Specimen: Nasopharyngeal Swab  Result Value Ref Range Status   SARS Coronavirus 2 POSITIVE (A) NEGATIVE Final    Comment: RESULT CALLED TO, READ BACK BY AND VERIFIED WITH: J APPELT,RN $RemoveBef'@1908'[id number]$  11/05/19 MKELLY (NOTE) SARS-CoV-2 target nucleic acids are DETECTED  SARS-CoV-2 RNA is generally detectable in upper respiratory specimens  during the acute phase of infection.  Positive results are indicative  of the presence of the identified virus, but do not rule out bacterial infection or co-infection with other pathogens not detected by the test.  Clinical correlation with patient history and  other diagnostic information is necessary to determine patient infection status.  The expected result is negative.  Fact Sheet for Patients:   StrictlyIdeas.no   Fact Sheet for Healthcare Providers:   BankingDealers.co.za    This test is not yet approved or cleared by the [place] FDA and  has been authorized for detection and/or diagnosis of SARS-CoV-2 by FDA under an Emergency Use Authorization (EUA).  This EUA will remain in effect (meaning this tes t can be used) for the duration of  the COVID-19 declaration under Section  564(b)(1) of the Act, 21 U.S.C. section 360-bbb-3(b)(1), unless the authorization is terminated or revoked sooner.  Performed at [facility], [address] [place], [place] [address]   Blood Culture (routine x 2)     Status: None   Collection Time: 11/06/19  1:56 PM   Specimen: BLOOD  Result Value Ref Range Status   Specimen Description BLOOD  Final   Special Requests NONE  Final   Culture   Final    NO GROWTH 5 DAYS Performed at [facility], [address], [place], [place] [address]    Report Status 11/11/2019 FINAL  Final  Blood Culture (routine x 2)     Status: None   Collection Time: 11/06/19  2:06 PM   Specimen: BLOOD  Result Value Ref Range Status   Specimen Description BLOOD  Final   Special Requests NONE  Final   Culture   Final    NO GROWTH 5 DAYS Performed at [facility], [address], [place], [place] [address]    Report Status 11/11/2019 FINAL  Final    Today   Subjective    Daryel Gerald today has no headache,no chest abdominal pain,no new weakness tingling or numbness, feels much better wants to go home today.  Objective   Blood pressure 145/85, pulse 73, temperature 98.2 F (36.8 C), temperature source Oral, resp. rate 20, height $RemoveBe'5\' 9"'SVpniXFJu$  (1.753 m), weight 115.7 kg, SpO2 95 %.   Intake/Output Summary (Last 24 hours) at 11/12/2019 1025 Last data filed at 11/12/2019 1011 Gross per 24 hour  Intake 360 ml  Output 1850 ml  Net -1490 ml    Exam  Awake Alert, No new F.N deficits, Normal affect [place].AT,PERRAL Supple Neck,No JVD, No cervical lymphadenopathy appriciated.  Symmetrical Chest wall movement, Good air movement bilaterally, CTAB RRR,No Gallops,Rubs or new Murmurs, No Parasternal Heave +ve B.Sounds, Abd Soft, Non tender, No organomegaly appriciated, No rebound -guarding or rigidity. No Cyanosis, Clubbing or edema, No new Rash or bruise   Data Review   CBC w Diff:  Lab Results  Component Value Date   WBC 9.3 11/12/2019   HGB 16.0  11/12/2019   HCT 49.6 11/12/2019   PLT 443 (H) 11/12/2019   LYMPHOPCT 10 11/11/2019   MONOPCT 4 11/11/2019   EOSPCT 0 11/11/2019   BASOPCT 0 11/11/2019    CMP:  Lab Results  Component Value Date   NA 136 11/11/2019   K 4.8 11/11/2019   CL 101 11/11/2019   CO2 26 11/11/2019   BUN 20 11/11/2019   CREATININE 1.06 11/11/2019   PROT 6.8 11/11/2019   ALBUMIN 3.2 (L) 11/11/2019   BILITOT 0.6 11/11/2019   ALKPHOS 28 (L) 11/11/2019   AST 37 11/11/2019   ALT 25 11/11/2019  .   Total Time in preparing paper work, data evaluation and todays exam - 25 minutes  Lala Lund M.D on 11/12/2019 at 10:25 AM  [facility]   Office  [phone]

## 2019-11-12 NOTE — TOC Transition Note (Addendum)
Transition of Care [facility]) - CM/SW Discharge Note   Patient Details  Name: Kevin Sanders MRN: [id number] Date of Birth: 2000/07/06  [facility]) CM/SW Contact:  Bess Kinds, RN Phone Number: [phone] 11/12/2019, 12:11 PM   Clinical Narrative:     Spoke with patient on the hospital room phone. Patient to transition home today. Stated that his girlfriend will be providing transportation home. He is working on getting set up with PCP and understands that it is a process and takes time to get initial appointment. Discussed need for Eliquis - patient is eligible for $10 copay card. Patient expressed appreciation. Eliquis information to tubed to department. Nursing made aware.   Additionally, notified of DME oxygen needs. Referral to [employer] for delivery to the room. Spoke with patient on room phone who advised to contact his grandmother, [name], concerning copay. [employer] made aware.   No further TOC needs identified.   Final next level of care: Home/Self Care Barriers to Discharge: No Barriers Identified   Patient Goals and CMS Choice Patient states their goals for this hospitalization and ongoing recovery are:: return home CMS Medicare.gov Compare Post Acute Care list provided to:: Patient Choice offered to / list presented to : NA  Discharge Placement                       Discharge Plan and Services                DME Arranged: N/A DME Agency: NA       HH Arranged: NA HH Agency: NA        Social Determinants of Health (SDOH) Interventions     Readmission Risk Interventions No flowsheet data found.

## 2019-11-12 NOTE — Progress Notes (Signed)
Patient is waiting for oxygen to be delivered to his room.

## 2019-12-13 ENCOUNTER — Ambulatory Visit: Payer: 59 | Admitting: Internal Medicine

## 2020-01-24 ENCOUNTER — Other Ambulatory Visit: Payer: Self-pay | Admitting: Internal Medicine

## 2020-01-26 NOTE — Telephone Encounter (Signed)
Please advise. Last prescribed on 10/25/2019. Last seen on 10/25/2019. It does not show that patient found a pcp yet as instructed on the last office visit.

## 2020-01-31 ENCOUNTER — Encounter: Payer: Self-pay | Admitting: Internal Medicine

## 2020-01-31 ENCOUNTER — Ambulatory Visit (INDEPENDENT_AMBULATORY_CARE_PROVIDER_SITE_OTHER): Payer: 59 | Admitting: Internal Medicine

## 2020-01-31 ENCOUNTER — Other Ambulatory Visit: Payer: Self-pay

## 2020-01-31 VITALS — BP 140/80 | HR 104 | Ht 69.0 in | Wt 279.0 lb

## 2020-01-31 DIAGNOSIS — E1010 Type 1 diabetes mellitus with ketoacidosis without coma: Secondary | ICD-10-CM

## 2020-01-31 DIAGNOSIS — R809 Proteinuria, unspecified: Secondary | ICD-10-CM | POA: Diagnosis not present

## 2020-01-31 DIAGNOSIS — E1129 Type 2 diabetes mellitus with other diabetic kidney complication: Secondary | ICD-10-CM | POA: Diagnosis not present

## 2020-01-31 DIAGNOSIS — I10 Essential (primary) hypertension: Secondary | ICD-10-CM | POA: Diagnosis not present

## 2020-01-31 LAB — POCT GLYCOSYLATED HEMOGLOBIN (HGB A1C): Hemoglobin A1C: 5.5 % (ref 4.0–5.6)

## 2020-01-31 LAB — POCT GLUCOSE (DEVICE FOR HOME USE): Glucose Fasting, POC: 88 mg/dL (ref 70–99)

## 2020-01-31 MED ORDER — LANTUS SOLOSTAR 100 UNIT/ML ~~LOC~~ SOPN
55.0000 [IU] | PEN_INJECTOR | Freq: Every day | SUBCUTANEOUS | 11 refills | Status: DC
Start: 2020-01-31 — End: 2020-06-05

## 2020-01-31 MED ORDER — METFORMIN HCL ER 500 MG PO TB24
500.0000 mg | ORAL_TABLET | Freq: Every day | ORAL | 1 refills | Status: DC
Start: 2020-01-31 — End: 2020-06-05

## 2020-01-31 MED ORDER — LOSARTAN POTASSIUM 50 MG PO TABS: 50.0000 mg | ORAL_TABLET | Freq: Every day | ORAL | 3 refills | Status: DC

## 2020-01-31 MED ORDER — AMLODIPINE BESYLATE 10 MG PO TABS
10.0000 mg | ORAL_TABLET | Freq: Every day | ORAL | 1 refills | Status: DC
Start: 2020-01-31 — End: 2020-06-05

## 2020-01-31 NOTE — Progress Notes (Signed)
Name: Kevin Sanders  Age/ Sex: 19 y.o., male   MRN/ DOB: [id number], December 22, 2000     PCP: Patient, No Pcp Per   Reason for Endocrinology Evaluation: Type 2 Diabetes Mellitus  Initial Endocrine Consultative Visit: 10/25/2019    PATIENT IDENTIFIER: Kevin Sanders is a 19 y.o. male with a past medical history of HTN and DM. The patient has followed with Endocrinology clinic since 10/25/2019 for consultative assistance with management of his diabetes.  DIABETIC HISTORY:  Kevin Sanders was diagnosed with DM 07/2019 after presenting to his PCP with symptomatic hyperglycemia. He was admitted for DKA, he had a c-peptide of 0.3 ng/dL during that admission with a concomitant serum glucose of 201 mg/ dL  . He was  discharged on MDI regimen  and metformin  . His hemoglobin A1c has ranged from 6.1%  in 10/2019, peaking at 12.1% in 07/2019.  By his initial visit to our clinic his A1c was 6.1 % . His C-peptide has improved at 1.02 ng/mL with a serum glucose of 90 mg/dL. GAD-65 and Islet cell Ab's were negative. We stopped prandial insulin and started Metformin     Grandmother with T2DM and lives with her.  SUBJECTIVE:   During the last visit (10/25/2019): A1c 6.1% . We stopped prandial insulin, reduced basal insulin and started Metformin      Today (01/31/2020): Kevin Sanders is here for a 3 month follow up on diabetes management.  He checks his blood sugars 0 times daily. The patient has not had hypoglycemic episodes that he is aware of. He started 3rd shift work and has not been checking glucose nor being very compliant with his medications.   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   Has stepped on a piece of metal over the weekend and was able to pull it off.     HOME DIABETES REGIMEN:  Lantus 70 units daily  Metformin 500 mg XR 2 tabs  BID - not taking    Statin: no ACE-I/ARB: yes    METER DOWNLOAD SUMMARY: Does not check     DIABETIC COMPLICATIONS: Microvascular complications:    Denies:  CKD, neuropathy   Last Eye Exam: Completed none  Macrovascular complications:   Denies: CAD, CVA, PVD   HISTORY:  Past Medical History:  Past Medical History:  Diagnosis Date   Diabetes ([place])    Hypertension    Past Surgical History:  Past Surgical History:  Procedure Laterality Date   none      Social History:  reports that he has never smoked. He has never used smokeless tobacco. He reports that he does not drink alcohol and does not use drugs. Family History:  Family History  Problem Relation Age of Onset   Bladder Cancer Neg Hx    Kidney cancer Neg Hx    Prostate cancer Neg Hx      HOME MEDICATIONS: Allergies as of 01/31/2020      Reactions   Augmentin [amoxicillin-pot Clavulanate]       Medication List       Accurate as of January 31, 2020  8:53 AM. If you have any questions, ask your nurse or doctor.        acetaminophen 500 MG tablet Commonly known as: TYLENOL Take 1,000 mg  by mouth every 6 (six) hours as needed.   albuterol 108 (90 Base) MCG/ACT inhaler Commonly known as: VENTOLIN HFA Inhale 2 puffs into the lungs every 6 (six) hours as needed for wheezing or shortness of breath.   amLODipine 10 MG tablet Commonly known as: NORVASC Take 1 tablet (10 mg total) by mouth daily.   apixaban 2.5 MG Tabs tablet Commonly known as: Eliquis Take 1 tablet (2.5 mg total) by mouth 2 (two) times daily.   blood glucose meter kit and supplies Kit Dispense based on patient and insurance preference. Use up to four times daily as directed. (FOR ICD-9 250.00, 250.01).   Insulin Pen Needle 32G X 4 MM Misc 1 Units by Does not apply route 4 (four) times daily -  before meals and at bedtime.   Lantus SoloStar 100 UNIT/ML Solostar Pen Generic drug: insulin glargine Inject 55 Units into  the skin daily. What changed: how much to take Changed by: Dorita Sciara, MD   losartan 50 MG tablet Commonly known as: COZAAR Take 1 tablet (50 mg total) by mouth daily.   metFORMIN 500 MG 24 hr tablet Commonly known as: GLUCOPHAGE-XR Take 1 tablet (500 mg total) by mouth daily with breakfast. What changed:   how much to take  when to take this Changed by: Dorita Sciara, MD        OBJECTIVE:   Vital Signs: BP 140/80    Pulse (!) 104    Ht 5' 9"  (1.753 m)    Wt 279 lb (126.6 kg)    SpO2 98%    BMI 41.20 kg/m   Wt Readings from Last 3 Encounters:  01/31/20 279 lb (126.6 kg)  11/11/19 255 lb 1.2 oz (115.7 kg)  11/05/19 283 lb (128.4 kg)     Exam: General: Pt appears well and is in NAD  Lungs: Clear with good BS bilat with no rales, rhonchi, or wheezes  Heart: RRR with normal S1 and S2 and no gallops; no murmurs; no rub  Abdomen: Normoactive bowel sounds, soft, nontender, without masses or organomegaly palpable  Extremities: No pretibial edema.   Neuro: MS is good with appropriate affect, pt is alert and Ox3    DM foot exam: 10/25/2019  The skin of the feet is intact without sores or ulcerations. The pedal pulses are 1+ on right and 1+ on left. The sensation is intact to a screening 5.07, 10 gram monofilament bilaterally   DATA REVIEWED:  Lab Results  Component Value Date   HGBA1C 5.5 01/31/2020   HGBA1C 6.1 (A) 10/25/2019   HGBA1C 12.1 (H) 07/30/2019   Lab Results  Component Value Date   MICROALBUR 51.9 (H) 10/25/2019   LDLCALC 135 (H) 10/25/2019   CREATININE 1.06 11/11/2019   Lab Results  Component Value Date   MICRALBCREAT 56.8 (H) 10/25/2019     Lab Results  Component Value Date   CHOL 198 10/25/2019   HDL 34.50 (L) 10/25/2019   LDLCALC 135 (H) 10/25/2019   LDLDIRECT 104.9 (H) 08/01/2019   TRIG 228 (H) 11/06/2019   CHOLHDL 6 10/25/2019          Results for [name], Sanders (MRN [id number]) as of 01/31/2020 07:16  Ref.  Range 10/25/2019 08:51  Glutamic Acid Decarb Ab Latest Ref Range: <5 IU/mL <5  Results for Kevin Sanders (MRN [id number]) as of 01/31/2020 07:16  Ref. Range 10/25/2019 08:51  ISLET CELL ANTIBODY SCREEN Latest Ref Range: NEGATIVE  NEGATIVE    ASSESSMENT /  PLAN / RECOMMENDATIONS:   1) Ketosis Prone DM, controlled, With microalbuminuria complications - Most recent A1c of 5.5 %. Goal A1c < 7.0 %.    - Despite imprefect adherence with medications and no glucose data , his A1c is low and this is concerning for hypoglycemia.  - I am going to reduce his insulin as below  - He developed GI side effects to Metformin but would like to give it another try   MEDICATIONS:  - Decrease Lantus to 55 units ONCE daily  - Metformin 500 mg , 1 tablet with breakfast   EDUCATION / INSTRUCTIONS:  BG monitoring instructions: Patient is instructed to check his blood sugars 1 times a day, week   Call [facility] if: BG persistently < 70   I reviewed the Rule of 15 for the treatment of hypoglycemia in detail with the patient. Literature supplied.     2) Diabetic complications:   Eye: unknown to  have known diabetic retinopathy. Pt urged to have an eye exam   Neuro/ Feet: Does not have known diabetic peripheral neuropathy .   Renal: Patient does not have known baseline CKD. He   is  on an ACEI/ARB at present.  3) HTN:   - This was slightly elevated. Will refill Amlodipine and losartan    Medications Losartan 50 mg daily  Amlodipine 10 mg daily      F/U in 3 months    Signed electronically by: Mack Guise, MD  [facility] End[facility]  [facility] [address] [place], [place] [address] Phone: [phone] FAX: [phone]   CC: Patient, No Pcp Per No address on file Phone: None  Fax: None  Return to Endocrinology clinic as below: Future Appointments  Date Time Provider [place]  05/03/2020  8:30 AM Raquel Sayres,  Melanie Crazier, MD [id number] None

## 2020-01-31 NOTE — Patient Instructions (Addendum)
-   Decrease Lantus to  55 units ONCE daily  - Metformin 500 mg , 1 tablet      HOW TO TREAT LOW BLOOD SUGARS (Blood sugar LESS THAN 70 MG/DL)  Please follow the RULE OF 15 for the treatment of hypoglycemia treatment (when your (blood sugars are less than 70 mg/dL)    STEP 1: Take 15 grams of carbohydrates when your blood sugar is low, which includes:   3-4 GLUCOSE TABS  OR  3-4 OZ OF JUICE OR REGULAR SODA OR  ONE TUBE OF GLUCOSE GEL     STEP 2: RECHECK blood sugar in 15 MINUTES STEP 3: If your blood sugar is still low at the 15 minute recheck --> then, go back to STEP 1 and treat AGAIN with another 15 grams of carbohydrates.

## 2020-05-03 ENCOUNTER — Ambulatory Visit: Payer: 59 | Admitting: Internal Medicine

## 2020-06-05 ENCOUNTER — Ambulatory Visit (INDEPENDENT_AMBULATORY_CARE_PROVIDER_SITE_OTHER): Payer: 59 | Admitting: Internal Medicine

## 2020-06-05 ENCOUNTER — Other Ambulatory Visit: Payer: Self-pay

## 2020-06-05 ENCOUNTER — Encounter: Payer: Self-pay | Admitting: Internal Medicine

## 2020-06-05 VITALS — BP 150/92 | HR 108 | Ht 69.0 in | Wt 281.0 lb

## 2020-06-05 DIAGNOSIS — R809 Proteinuria, unspecified: Secondary | ICD-10-CM

## 2020-06-05 DIAGNOSIS — E1129 Type 2 diabetes mellitus with other diabetic kidney complication: Secondary | ICD-10-CM

## 2020-06-05 DIAGNOSIS — I10 Essential (primary) hypertension: Secondary | ICD-10-CM | POA: Diagnosis not present

## 2020-06-05 LAB — POCT GLYCOSYLATED HEMOGLOBIN (HGB A1C): Hemoglobin A1C: 7.7 % — AB (ref 4.0–5.6)

## 2020-06-05 LAB — POCT GLUCOSE (DEVICE FOR HOME USE): POC Glucose: 130 mg/dl — AB (ref 70–99)

## 2020-06-05 MED ORDER — LOSARTAN POTASSIUM 50 MG PO TABS
50.0000 mg | ORAL_TABLET | Freq: Every day | ORAL | 4 refills | Status: DC
Start: 2020-06-05 — End: 2023-09-01

## 2020-06-05 MED ORDER — DAPAGLIFLOZIN PROPANEDIOL 5 MG PO TABS: 5.0000 mg | ORAL_TABLET | Freq: Every day | ORAL | 6 refills | Status: DC

## 2020-06-05 MED ORDER — AMLODIPINE BESYLATE 10 MG PO TABS: 10.0000 mg | ORAL_TABLET | Freq: Every day | ORAL | 4 refills | Status: AC

## 2020-06-05 NOTE — Patient Instructions (Signed)
-   STOP Metformin  - STOP Lantus  - Start Farxiga 5 mg, 1 tablet daily      HOW TO TREAT LOW BLOOD SUGARS (Blood sugar LESS THAN 70 MG/DL)  Please follow the RULE OF 15 for the treatment of hypoglycemia treatment (when your (blood sugars are less than 70 mg/dL)    STEP 1: Take 15 grams of carbohydrates when your blood sugar is low, which includes:   3-4 GLUCOSE TABS  OR  3-4 OZ OF JUICE OR REGULAR SODA OR  ONE TUBE OF GLUCOSE GEL     STEP 2: RECHECK blood sugar in 15 MINUTES STEP 3: If your blood sugar is still low at the 15 minute recheck --> then, go back to STEP 1 and treat AGAIN with another 15 grams of carbohydrates.

## 2020-06-05 NOTE — Progress Notes (Signed)
Name: Kevin Sanders  Age/ Sex: 20 y.o., male   MRN/ DOB: [id number], 2001-03-06     PCP: Patient, No Pcp Per   Reason for Endocrinology Evaluation: Type 2 Diabetes Mellitus  Initial Endocrine Consultative Visit: 10/25/2019    PATIENT IDENTIFIER: Kevin Sanders is a 20 y.o. male with a past medical history of HTN and DM. The patient has followed with Endocrinology clinic since 10/25/2019 for consultative assistance with management of his diabetes.  DIABETIC HISTORY:  Kevin Sanders was diagnosed with DM 07/2019 after presenting to his PCP with symptomatic hyperglycemia. He was admitted for DKA, he had a c-peptide of 0.3 ng/dL during that admission with a concomitant serum glucose of 201 mg/ dL  . He was  discharged on MDI regimen  and metformin  . His hemoglobin A1c has ranged from 6.1%  in 10/2019, peaking at 12.1% in 07/2019.  By his initial visit to our clinic his A1c was 6.1 % . His C-peptide has improved at 1.02 ng/mL with a serum glucose of 90 mg/dL. GAD-65 and Islet cell Ab's were negative. We stopped prandial insulin and started Metformin     Grandmother with T2DM and lives with her.  SUBJECTIVE:   During the last visit (01/31/2020): A1c 5.5% . We reduced basal insulin and continued  Metformin    Today (06/05/2020): Kevin Sanders is here for a follow up on diabetes management.  He checks his blood sugars 1 times weekly . The patient has had hypoglycemic episodes, he is symptomatic during these episodes.    Metformin tears his stomach apart , last time taken 2 months  Lantus was last taken 2 months ago    HOME DIABETES REGIMEN:  Lantus 55 units daily  Metformin 500 mg XR daily    Statin: no ACE-I/ARB: yes    METER DOWNLOAD SUMMARY: Does not check     DIABETIC COMPLICATIONS: Microvascular complications:    Denies:  CKD, neuropathy   Last Eye Exam: Completed none  Macrovascular complications:   Denies: CAD, CVA, PVD   HISTORY:  Past Medical History:  Past  Medical History:  Diagnosis Date  . Diabetes ([place])   . Hypertension    Past Surgical History:  Past Surgical History:  Procedure Laterality Date  . none      Social History:  reports that he has never smoked. He has never used smokeless tobacco. He reports that he does not drink alcohol and does not use drugs. Family History:  Family History  Problem Relation Age of Onset  . Bladder Cancer Neg Hx   . Kidney cancer Neg Hx   . Prostate cancer Neg Hx      HOME MEDICATIONS: Allergies as of 06/05/2020      Reactions   Augmentin [amoxicillin-pot Clavulanate]       Medication List       Accurate as of June 05, 2020  2:49 PM. If you have any questions, ask your nurse or doctor.        acetaminophen 500 MG tablet Commonly known as: TYLENOL Take 1,000 mg by mouth every 6 (six) hours as needed.   albuterol 108 (90 Base) MCG/ACT inhaler Commonly known as: VENTOLIN HFA Inhale 2 puffs into the lungs every 6 (six) hours as needed for wheezing or shortness of breath.   amLODipine 10 MG tablet Commonly known as: NORVASC Take 1 tablet (10 mg total) by mouth daily.   apixaban 2.5 MG Tabs tablet Commonly known as: Eliquis Take 1 tablet (2.5  mg total) by mouth 2 (two) times daily.   blood glucose meter kit and supplies Kit Dispense based on patient and insurance preference. Use up to four times daily as directed. (FOR ICD-9 250.00, 250.01).   dapagliflozin propanediol 5 MG Tabs tablet Commonly known as: Farxiga Take 1 tablet (5 mg total) by mouth daily before breakfast. Started by: Dorita Sciara, MD   Insulin Pen Needle 32G X 4 MM Misc 1 Units by Does not apply route 4 (four) times daily -  before meals and at bedtime.   Lantus SoloStar 100 UNIT/ML Solostar Pen Generic drug: insulin glargine Inject 55 Units into the skin daily.   losartan 50 MG tablet Commonly known as: COZAAR Take 1 tablet (50 mg total) by mouth daily.   metFORMIN 500 MG 24 hr tablet Commonly  known as: GLUCOPHAGE-XR Take 1 tablet (500 mg total) by mouth daily with breakfast.        OBJECTIVE:   Vital Signs: BP (!) 150/92   Pulse (!) 108   Ht _0  (1.753 m)   Wt 281 lb (127.5 kg)   SpO2 98%   BMI 41.50 kg/m   Wt Readings from Last 3 Encounters:  06/05/20 281 lb (127.5 kg)  01/31/20 279 lb (126.6 kg)  11/11/19 255 lb 1.2 oz (115.7 kg)     Exam: General: Pt appears well and is in NAD  Lungs: Clear with good BS bilat with no rales, rhonchi, or wheezes  Heart: RRR with normal S1 and S2 and no gallops; no murmurs; no rub  Abdomen: Normoactive bowel sounds, soft, nontender, without masses or organomegaly palpable  Extremities: No pretibial edema.   Neuro: MS is good with appropriate affect, pt is alert and Ox3    DM foot exam: 10/25/2019  The skin of the feet is intact without sores or ulcerations. The pedal pulses are 1+ on right and 1+ on left. The sensation is intact to a screening 5.07, 10 gram monofilament bilaterally   DATA REVIEWED:  Lab Results  Component Value Date   HGBA1C 7.7 (A) 06/05/2020   HGBA1C 5.5 01/31/2020   HGBA1C 6.1 (A) 10/25/2019   Lab Results  Component Value Date   MICROALBUR 51.9 (H) 10/25/2019   LDLCALC 135 (H) 10/25/2019   CREATININE 1.06 11/11/2019   Lab Results  Component Value Date   MICRALBCREAT 56.8 (H) 10/25/2019     Lab Results  Component Value Date   CHOL 198 10/25/2019   HDL 34.50 (L) 10/25/2019   LDLCALC 135 (H) 10/25/2019   LDLDIRECT 104.9 (H) 08/01/2019   TRIG 228 (H) 11/06/2019   CHOLHDL 6 10/25/2019          Results for Kevin Sanders, Kevin Sanders (MRN [id number]) as of 01/31/2020 07:16  Ref. Range 10/25/2019 08:51  Glutamic Acid Decarb Ab Latest Ref Range: <5 IU/mL <5  Results for Kevin Sanders, Kevin Sanders (MRN [id number]) as of 01/31/2020 07:16  Ref. Range 10/25/2019 08:51  ISLET CELL ANTIBODY SCREEN Latest Ref Range: NEGATIVE  NEGATIVE    ASSESSMENT / PLAN / RECOMMENDATIONS:   1) Ketosis Prone DM,  Sub-Optimally controlled, With microalbuminuria complications - Most recent A1c of 7.7 %. Goal A1c < 7.0 %.     - A1c up from 5.5 %, he has self discontinued metformin and insulin  - Intolerant to Metformin  - Discussed starting SGLT-2 inhibitors, cautioned against genital infections  - Discussed importance of low carb diet   MEDICATIONS:  STOP Metformin  - STOP Lantus  - Start  Farxiga 5 mg, 1 tablet daily     EDUCATION / INSTRUCTIONS:  BG monitoring instructions: Patient is instructed to check his blood sugars 1 times a day, week   Call [place] [facility] if: BG persistently < 70  . I reviewed the Rule of 15 for the treatment of hypoglycemia in detail with the patient. Literature supplied.     2) Diabetic complications:   Eye: unknown to  have known diabetic retinopathy. Pt urged to have an eye exam   Neuro/ Feet: Does not have known diabetic peripheral neuropathy .   Renal: Patient does not have known baseline CKD. He   is  on an ACEI/ARB at present.  3) HTN:   - He has not taken antihypertensives in the while. He is under the impression he is only on amlodipine - I have encouraged him to restart amlodipine and cozaar, will defer further management to PCP   Medications Losartan 50 mg daily  Amlodipine 10 mg daily      F/U in 3 months    Signed electronically by: Mack Guise, MD  [facility] Endocrinology  [facility] [address] [place], [place] [address] Phone: [phone] FAX: [phone]   CC: Patient, No Pcp Per No address on file Phone: None  Fax: None  Return to Endocrinology clinic as below: No future appointments.

## 2020-09-06 ENCOUNTER — Ambulatory Visit: Payer: 59 | Admitting: Internal Medicine

## 2020-09-15 ENCOUNTER — Other Ambulatory Visit: Payer: Self-pay | Admitting: Nephrology

## 2020-09-15 ENCOUNTER — Other Ambulatory Visit (HOSPITAL_COMMUNITY): Payer: Self-pay | Admitting: Nephrology

## 2020-09-15 DIAGNOSIS — R809 Proteinuria, unspecified: Secondary | ICD-10-CM

## 2020-09-15 DIAGNOSIS — R82998 Other abnormal findings in urine: Secondary | ICD-10-CM

## 2020-09-15 DIAGNOSIS — E1122 Type 2 diabetes mellitus with diabetic chronic kidney disease: Secondary | ICD-10-CM

## 2020-09-15 DIAGNOSIS — E781 Pure hyperglyceridemia: Secondary | ICD-10-CM

## 2020-09-15 DIAGNOSIS — I10 Essential (primary) hypertension: Secondary | ICD-10-CM

## 2020-11-15 ENCOUNTER — Ambulatory Visit: Payer: 59 | Admitting: Internal Medicine

## 2021-01-07 IMAGING — CT CT ANGIO CHEST
1 of 6 series · 16 of 37 positions shown · IV contrast (Omnipaque or Isovue)
Comparison: None

CLINICAL DATA: Shortness of breath, oxygen desaturation, FRR29-I6
positive

EXAM:
CT ANGIOGRAPHY CHEST WITH CONTRAST
TECHNIQUE: Multidetector CT imaging of the chest was performed using the
standard protocol during bolus administration of intravenous
contrast. Multiplanar CT image reconstructions and MIPs were
obtained to evaluate the vascular anatomy. Initial contrast
opacification of the pulmonary arteries was nondiagnostic for
assessment for pulmonary embolism and patient was
reinjected/repeated.
CONTRAST:  Total of 150mL OMNIPAQUE IOHEXOL 350 MG/ML SOLN IV

[Series 11: pe axial thins · axial · 0.86mm/px · z∈[-530,-274]mm · 16 of 294 slices shown]
[im 19/294  lung]
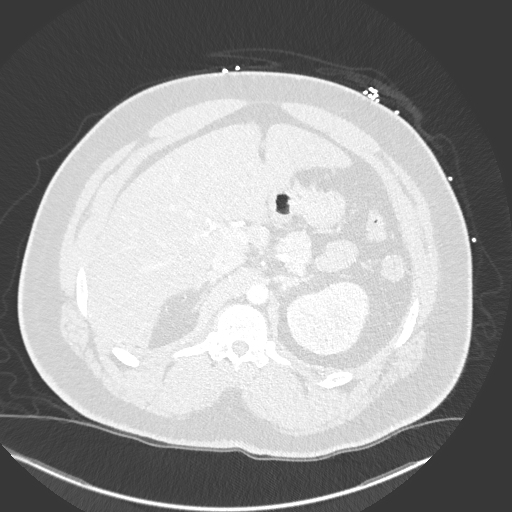
[im 37/294  mediastinal]
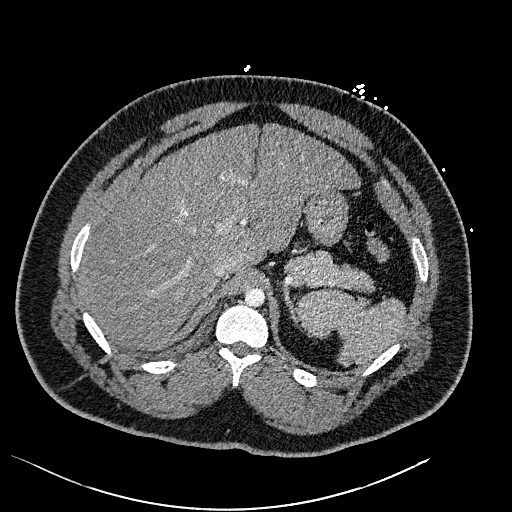
[im 55/294  lung]
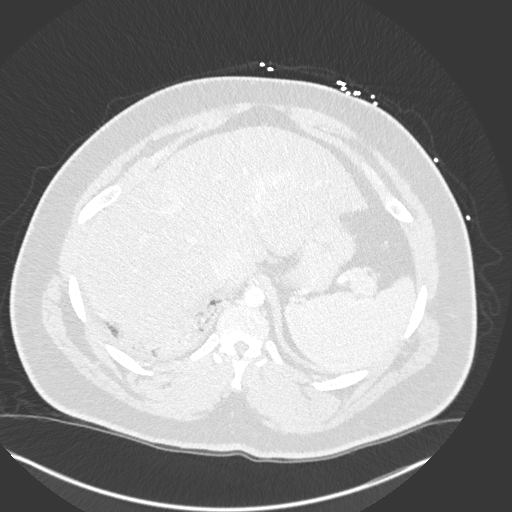
[im 74/294  mediastinal]
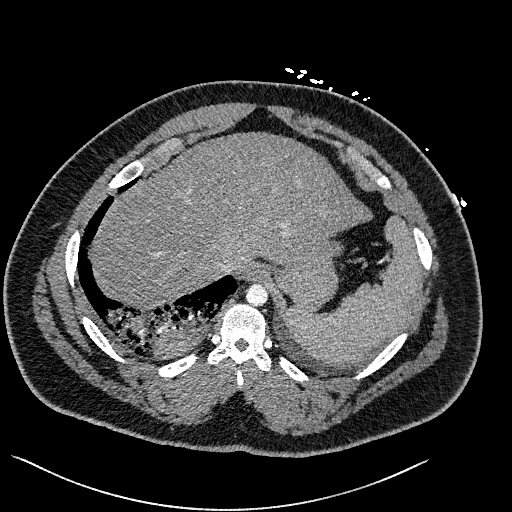
[im 92/294  lung]
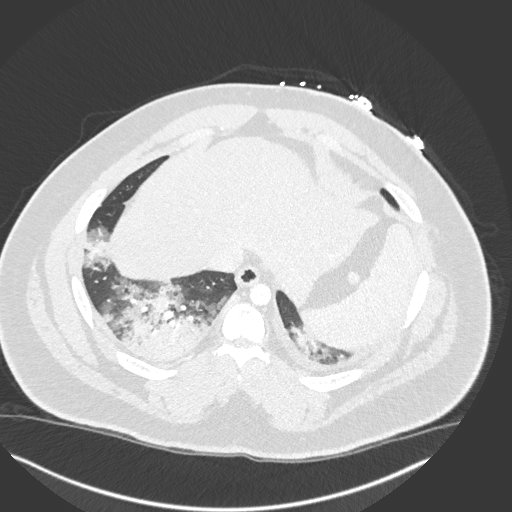
[im 110/294  mediastinal]
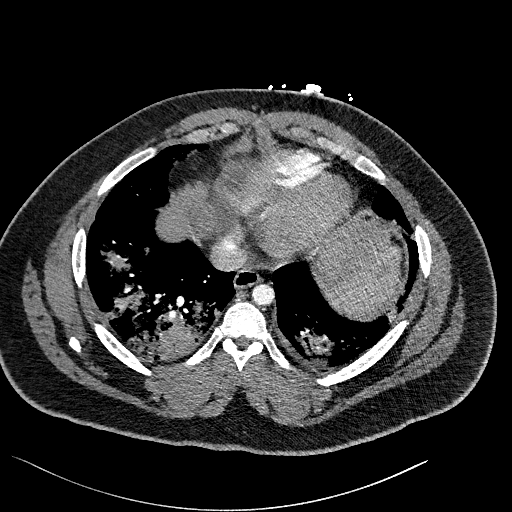
[im 129/294  lung]
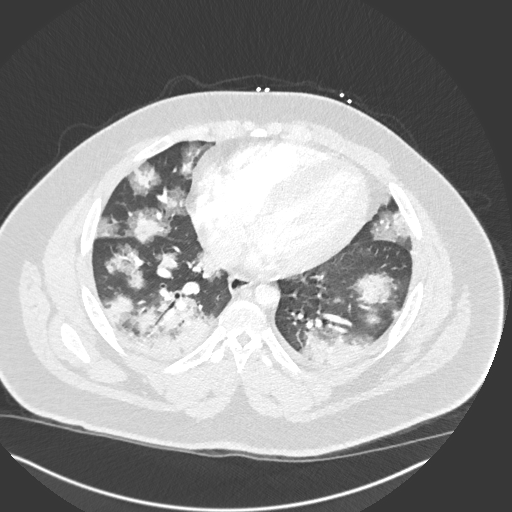
[im 147/294  mediastinal]
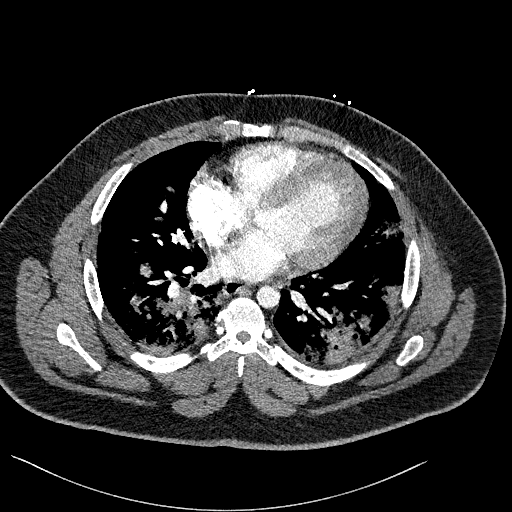
[im 157/294  lung]
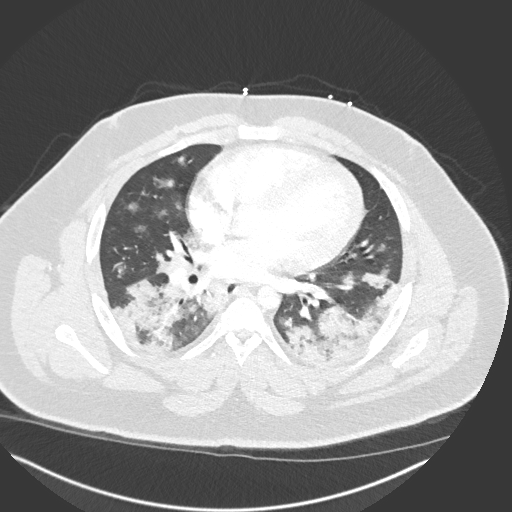
[im 165/294  mediastinal]
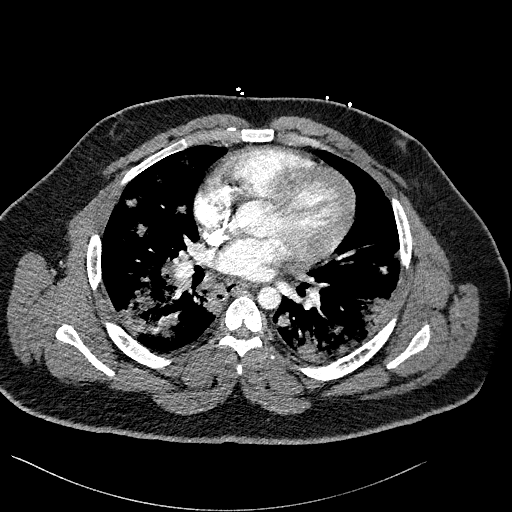
[im 184/294  lung]
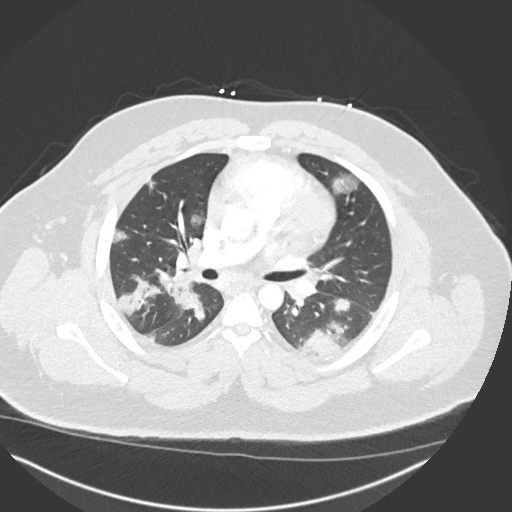
[im 202/294  mediastinal]
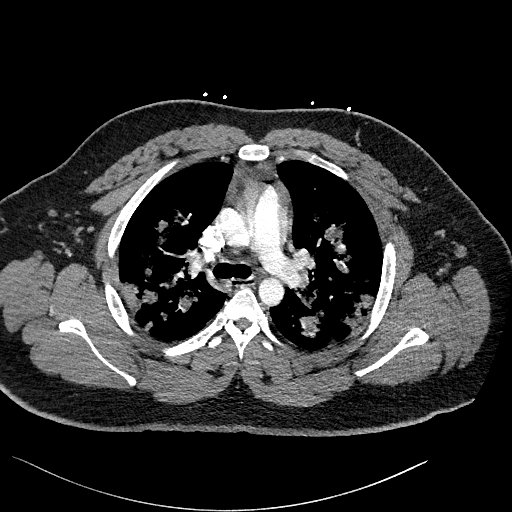
[im 220/294  lung]
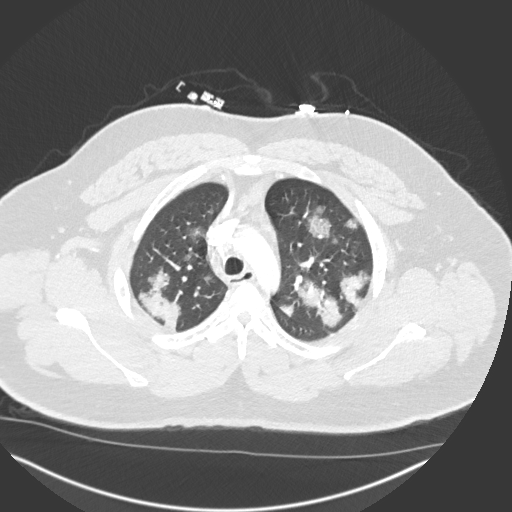
[im 239/294  mediastinal]
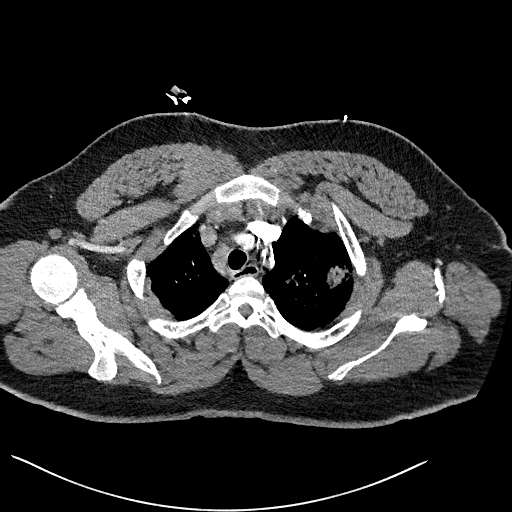
[im 257/294  lung]
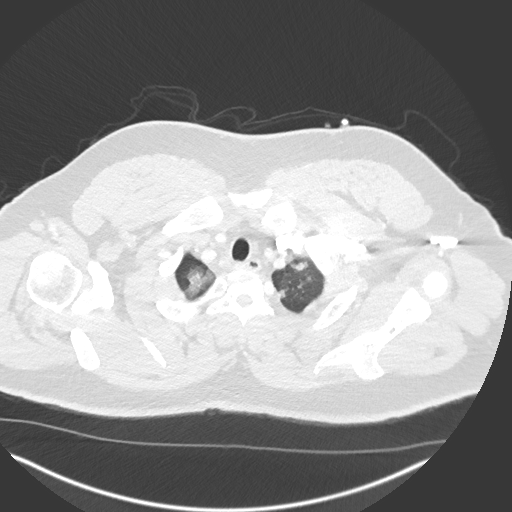
[im 275/294  mediastinal]
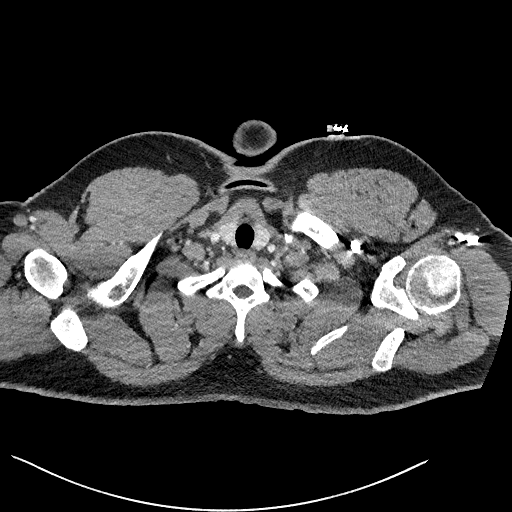

[16 of 37 positions shown; findings below may reference images not displayed]

FINDINGS: Cardiovascular: Aorta normal caliber without aneurysm or dissection.
Heart unremarkable. No pericardial effusion. Pulmonary arteries
adequately opacified following reinjection. Pulmonary arteries
appear patent. No evidence of pulmonary embolism.

Mediastinum/Nodes: Base of cervical region normal appearance.
Esophagus unremarkable. No thoracic adenopathy.

Lungs/Pleura: Patchy BILATERAL airspace infiltrates throughout all
lobes consistent with multifocal pneumonia and history of FRR29-I6.
No pleural effusion or pneumothorax.

Upper Abdomen: Unremarkable

Musculoskeletal: No acute osseous findings.

Review of the MIP images confirms the above findings.
IMPRESSION: No evidence of pulmonary embolism.

Patchy BILATERAL airspace infiltrates throughout all lobes
consistent with multifocal pneumonia and FRR29-I6.

## 2021-06-04 ENCOUNTER — Other Ambulatory Visit: Payer: Self-pay | Admitting: Internal Medicine

## 2021-11-26 NOTE — Progress Notes (Unsigned)
11/27/2021 4:01 PM   Kevin Sanders Jan 15, 2001 [id number]  Referring provider: Mariana Arn, [place],  [place] [address]  No chief complaint on file.   HPI: 21 year old male who presents today for further evaluation of hematospermia.  He is a known personal history of chronic testicular pain is been seen by myself and Dr. Bernardo Heater, last just over 2 years ago.  Notably around that time, he was having some hematospermia.  He was treated for presumptive epididymitis which was supported by testicular ultrasound.Marland Kitchen   PMH: Past Medical History:  Diagnosis Date   Diabetes ([place])    Hypertension     Surgical History: Past Surgical History:  Procedure Laterality Date   none      Home Medications:  Allergies as of 11/27/2021       Reactions   Augmentin [amoxicillin-pot Clavulanate]         Medication List        Accurate as of November 26, 2021  4:01 PM. If you have any questions, ask your nurse or doctor.          acetaminophen 500 MG tablet Commonly known as: TYLENOL Take 1,000 mg by mouth every 6 (six) hours as needed.   albuterol 108 (90 Base) MCG/ACT inhaler Commonly known as: VENTOLIN HFA Inhale 2 puffs into the lungs every 6 (six) hours as needed for wheezing or shortness of breath.   amLODipine 10 MG tablet Commonly known as: NORVASC Take 1 tablet (10 mg total) by mouth daily.   apixaban 2.5 MG Tabs tablet Commonly known as: Eliquis Take 1 tablet (2.5 mg total) by mouth 2 (two) times daily.   blood glucose meter kit and supplies Kit Dispense based on patient and insurance preference. Use up to four times daily as directed. (FOR ICD-9 250.00, 250.01).   dapagliflozin propanediol 5 MG Tabs tablet Commonly known as: Farxiga Take 1 tablet (5 mg total) by mouth daily before breakfast.   losartan 50 MG tablet Commonly known as: COZAAR Take 1 tablet (50 mg total) by mouth daily.        Allergies:  Allergies  Allergen  Reactions   Augmentin [Amoxicillin-Pot Clavulanate]     Family History: Family History  Problem Relation Age of Onset   Bladder Cancer Neg Hx    Kidney cancer Neg Hx    Prostate cancer Neg Hx     Social History:  reports that he has never smoked. He has never used smokeless tobacco. He reports that he does not drink alcohol and does not use drugs.   Physical Exam: There were no vitals taken for this visit.  Constitutional:  Alert and oriented, No acute distress. HEENT: [place] AT, moist mucus membranes.  Trachea midline, no masses. Cardiovascular: No clubbing, cyanosis, or edema. Respiratory: Normal respiratory effort, no increased work of breathing. GI: Abdomen is soft, nontender, nondistended, no abdominal masses GU: No CVA tenderness Skin: No rashes, bruises or suspicious lesions. Neurologic: Grossly intact, no focal deficits, moving all 4 extremities. Psychiatric: Normal mood and affect.  Laboratory Data: Lab Results  Component Value Date   WBC 9.3 11/12/2019   HGB 16.0 11/12/2019   HCT 49.6 11/12/2019   MCV 74.0 (L) 11/12/2019   PLT 443 (H) 11/12/2019    Lab Results  Component Value Date   CREATININE 1.06 11/11/2019    No results found for: "PSA"  No results found for: "TESTOSTERONE"  Lab Results  Component Value Date   HGBA1C 7.7 (A) 06/05/2020  Urinalysis    Component Value Date/Time   COLORURINE YELLOW (A) 08/04/2019 1410   APPEARANCEUR Clear 08/18/2019 1031   LABSPEC 1.010 08/04/2019 1410   PHURINE 6.0 08/04/2019 1410   GLUCOSEU Negative 08/18/2019 1031   HGBUR LARGE (A) 08/04/2019 1410   BILIRUBINUR Negative 08/18/2019 1031   KETONESUR 5 (A) 08/04/2019 1410   PROTEINUR Trace (A) 08/18/2019 1031   PROTEINUR 100 (A) 08/04/2019 1410   NITRITE Negative 08/18/2019 1031   NITRITE NEGATIVE 08/04/2019 1410   LEUKOCYTESUR Negative 08/18/2019 1031   LEUKOCYTESUR NEGATIVE 08/04/2019 1410    Lab Results  Component Value Date   LABMICR See below:  08/18/2019   WBCUA None seen 08/18/2019   LABEPIT None seen 08/18/2019   BACTERIA None seen 08/18/2019    Pertinent Imaging: *** No results found for this or any previous visit.  No results found for this or any previous visit.  No results found for this or any previous visit.  No results found for this or any previous visit.  No results found for this or any previous visit.  No results found for this or any previous visit.  No results found for this or any previous visit.  Results for orders placed during the hospital encounter of 01/17/17  CT RENAL STONE STUDY  Narrative CLINICAL DATA:  Patient with history of testicular torsion. Intermittent groin pain.  EXAM: CT ABDOMEN AND PELVIS WITHOUT CONTRAST  TECHNIQUE: Multidetector CT imaging of the abdomen and pelvis was performed following the standard protocol without IV contrast.  COMPARISON:  None.  FINDINGS: Lower chest: Normal heart size. Minimal atelectasis left lung base. No pleural effusion.  Hepatobiliary: The liver is diffusely low in attenuation compatible with steatosis. Regional sparing adjacent to the gallbladder fossa.  Pancreas: Unremarkable  Spleen: Unremarkable  Adrenals/Urinary Tract: Adrenal glands are normal. Kidneys are symmetric in size. No hydronephrosis. No nephroureterolithiasis. Urinary bladder is unremarkable.  Stomach/Bowel: No abnormal bowel wall thickening or evidence for bowel obstruction. No free fluid or free intraperitoneal air. Normal appendix. Normal morphology of the stomach.  Vascular/Lymphatic: Normal caliber abdominal aorta. Prominent subcentimeter pelvic and external iliac lymph nodes are demonstrated.  Reproductive: Prostate is unremarkable.  Other: There is lobular soft tissue thickening overlying the right hemiscrotum (image 92; series 2).  Musculoskeletal: No aggressive or acute appearing osseous lesions.  IMPRESSION: 1. There is nonspecific soft tissue  thickening about the right hemiscrotum. Correlate for possible infectious process versus overlying material. 2. Otherwise no acute process within the abdomen or pelvis. 3. Hepatic steatosis. 4. Multiple prominent external iliac and retroperitoneal lymph nodes are nonspecific however likely reactive in etiology.   Electronically Signed By: Lovey Newcomer M.D. On: 01/17/2017 16:51   Assessment & Plan:    There are no diagnoses linked to this encounter.  No follow-ups on file.  Hollice Espy, MD  [facility] [address], [place], [place] [address] [phone]

## 2021-11-27 ENCOUNTER — Encounter: Payer: Self-pay | Admitting: Urology

## 2021-11-27 ENCOUNTER — Ambulatory Visit (INDEPENDENT_AMBULATORY_CARE_PROVIDER_SITE_OTHER): Payer: 59 | Admitting: Urology

## 2021-11-27 VITALS — BP 149/92 | HR 65 | Ht 69.0 in | Wt 259.0 lb

## 2021-11-27 DIAGNOSIS — R310 Gross hematuria: Secondary | ICD-10-CM | POA: Diagnosis not present

## 2021-11-27 DIAGNOSIS — R361 Hematospermia: Secondary | ICD-10-CM

## 2021-11-27 LAB — URINALYSIS, COMPLETE
Bilirubin, UA: NEGATIVE
Leukocytes,UA: NEGATIVE
Nitrite, UA: NEGATIVE
Specific Gravity, UA: 1.02 (ref 1.005–1.030)
Urobilinogen, Ur: 0.2 mg/dL (ref 0.2–1.0)
pH, UA: 5 (ref 5.0–7.5)

## 2021-11-27 LAB — MICROSCOPIC EXAMINATION

## 2021-12-18 NOTE — Progress Notes (Signed)
   12/20/21  CC:  Chief Complaint  Patient presents with   Cysto    HPI: 21 year old male with hematospermia and gross hematuria who presents today for office cystoscopy.  Renal ultrasound was ordered but not performed.  There were no vitals taken for this visit. NED. A&Ox3.   No respiratory distress   Abd soft, NT, ND Normal phallus with bilateral descended testicles  Cystoscopy Procedure Note  Patient identification was confirmed, informed consent was obtained, and patient was prepped using Betadine solution.  Lidocaine jelly was administered per urethral meatus.     Pre-Procedure: - Inspection reveals a normal caliber ureteral meatus.  Procedure: The flexible cystoscope was introduced without difficulty - No urethral strictures/lesions are present. - Normal prostate  - Normal bladder neck - Bilateral ureteral orifices identified - Bladder mucosa  reveals no ulcers, tumors, or lesions - No bladder stones - No trabeculation  Retroflexion unremarkable   Post-Procedure: - Patient tolerated the procedure well  Assessment/ Plan:  1. Gross hematuria Cystoscopy today was unremarkable, patient was reassured  He is given the number for radiology to schedule his renal ultrasound, will call him with these results, reiterated the importance of this - Urinalysis, Complete  2. Hematospermia Likely benign   As needed  Hollice Espy, MD

## 2021-12-19 ENCOUNTER — Ambulatory Visit (INDEPENDENT_AMBULATORY_CARE_PROVIDER_SITE_OTHER): Payer: 59 | Admitting: Urology

## 2021-12-19 ENCOUNTER — Encounter: Payer: Self-pay | Admitting: Urology

## 2021-12-19 VITALS — Ht 69.0 in

## 2021-12-19 DIAGNOSIS — R361 Hematospermia: Secondary | ICD-10-CM

## 2021-12-19 DIAGNOSIS — R310 Gross hematuria: Secondary | ICD-10-CM

## 2021-12-19 NOTE — Patient Instructions (Signed)
Please call to schedule your CT [phone].

## 2021-12-20 LAB — URINALYSIS, COMPLETE
Bilirubin, UA: NEGATIVE
Ketones, UA: NEGATIVE
Leukocytes,UA: NEGATIVE
Nitrite, UA: NEGATIVE
RBC, UA: NEGATIVE
Specific Gravity, UA: <1.005 — ABNORMAL LOW (ref 1.005–1.030)
Urobilinogen, Ur: 0.2 mg/dL (ref 0.2–1.0)
pH, UA: 5.5 (ref 5.0–7.5)

## 2021-12-20 LAB — MICROSCOPIC EXAMINATION
Bacteria, UA: NONE SEEN
RBC, Urine: NONE SEEN /hpf (ref 0–2)

## 2023-03-17 ENCOUNTER — Ambulatory Visit: Payer: 59 | Admitting: Physician Assistant

## 2023-03-21 ENCOUNTER — Encounter: Payer: Self-pay | Admitting: Physician Assistant

## 2023-08-15 DIAGNOSIS — A749 Chlamydial infection, unspecified: Secondary | ICD-10-CM | POA: Diagnosis not present

## 2023-08-15 DIAGNOSIS — I10 Essential (primary) hypertension: Secondary | ICD-10-CM | POA: Diagnosis not present

## 2023-08-15 DIAGNOSIS — R310 Gross hematuria: Secondary | ICD-10-CM | POA: Insufficient documentation

## 2023-08-15 DIAGNOSIS — Z79899 Other long term (current) drug therapy: Secondary | ICD-10-CM | POA: Diagnosis not present

## 2023-08-15 DIAGNOSIS — E119 Type 2 diabetes mellitus without complications: Secondary | ICD-10-CM | POA: Diagnosis not present

## 2023-08-15 DIAGNOSIS — R300 Dysuria: Secondary | ICD-10-CM | POA: Diagnosis present

## 2023-08-15 DIAGNOSIS — Z7901 Long term (current) use of anticoagulants: Secondary | ICD-10-CM | POA: Diagnosis not present

## 2023-08-15 NOTE — ED Triage Notes (Signed)
 Pt reports having blood in urine after intercourse this evening. Pt states this has happened before and he was following with urology but has not recently been to see the urologist. Pt denies any other symptoms at this time. Pt denies blood thinner use.

## 2023-08-16 ENCOUNTER — Emergency Department
Admission: EM | Admit: 2023-08-16 | Discharge: 2023-08-16 | Disposition: A | Discharge status: Home / Self Care | Payer: Self-pay | Attending: Emergency Medicine | Admitting: Emergency Medicine

## 2023-08-16 DIAGNOSIS — A749 Chlamydial infection, unspecified: Secondary | ICD-10-CM

## 2023-08-16 DIAGNOSIS — I10 Essential (primary) hypertension: Secondary | ICD-10-CM

## 2023-08-16 DIAGNOSIS — R310 Gross hematuria: Secondary | ICD-10-CM

## 2023-08-16 LAB — URINALYSIS, ROUTINE W REFLEX MICROSCOPIC
Bacteria, UA: NONE SEEN
Bilirubin Urine: NEGATIVE
Glucose, UA: NEGATIVE mg/dL
Ketones, ur: NEGATIVE mg/dL
Leukocytes,Ua: NEGATIVE
Nitrite: NEGATIVE
Protein, ur: >=300 mg/dL — AB
RBC / HPF: >50 RBC/hpf (ref 0–5)
Specific Gravity, Urine: 1.014 (ref 1.005–1.030)
Squamous Epithelial / HPF: 0 /HPF (ref 0–5)
pH: 5 (ref 5.0–8.0)

## 2023-08-16 LAB — CBC
HCT: 49.5 % (ref 39.0–52.0)
Hemoglobin: 16.7 g/dL (ref 13.0–17.0)
MCH: 25.5 pg — ABNORMAL LOW (ref 26.0–34.0)
MCHC: 33.7 g/dL (ref 30.0–36.0)
MCV: 75.5 fL — ABNORMAL LOW (ref 80.0–100.0)
Platelets: 285 10*3/uL (ref 150–400)
RBC: 6.56 MIL/uL — ABNORMAL HIGH (ref 4.22–5.81)
RDW: 13.2 % (ref 11.5–15.5)
WBC: 8.6 10*3/uL (ref 4.0–10.5)
nRBC: 0 % (ref 0.0–0.2)

## 2023-08-16 LAB — BASIC METABOLIC PANEL WITH GFR
Anion gap: 11 (ref 5–15)
BUN: 9 mg/dL (ref 6–20)
CO2: 24 mmol/L (ref 22–32)
Calcium: 9.4 mg/dL (ref 8.9–10.3)
Chloride: 103 mmol/L (ref 98–111)
Creatinine, Ser: 1.04 mg/dL (ref 0.61–1.24)
GFR, Estimated: >60 mL/min (ref >60)
Glucose, Bld: 172 mg/dL — ABNORMAL HIGH (ref 70–99)
Potassium: 3.8 mmol/L (ref 3.5–5.1)
Sodium: 138 mmol/L (ref 135–145)

## 2023-08-16 LAB — CHLAMYDIA/NGC RT PCR (ARMC ONLY)
Chlamydia Tr: DETECTED — AB
N gonorrhoeae: NOT DETECTED

## 2023-08-16 MED ORDER — AMLODIPINE BESYLATE 10 MG PO TABS: 10.0000 mg | ORAL_TABLET | Freq: Every day | ORAL | 2 refills | Status: DC

## 2023-08-16 MED ORDER — PHENAZOPYRIDINE HCL 200 MG PO TABS: 200.0000 mg | ORAL_TABLET | Freq: Three times a day (TID) | ORAL | 0 refills | Status: DC | PRN

## 2023-08-16 MED ORDER — PHENAZOPYRIDINE HCL 100 MG PO TABS: 100.0000 mg | ORAL_TABLET | Freq: Three times a day (TID) | ORAL | 0 refills | Status: DC | PRN

## 2023-08-16 MED ORDER — CEPHALEXIN 500 MG PO CAPS: 500.0000 mg | ORAL_CAPSULE | Freq: Two times a day (BID) | ORAL | 0 refills | Status: DC

## 2023-08-16 MED ORDER — CEPHALEXIN 500 MG PO CAPS
500.0000 mg | ORAL_CAPSULE | Freq: Once | ORAL | Status: AC
  Administered 2023-08-16: 500 mg via ORAL
  Filled 2023-08-16: qty 1

## 2023-08-16 MED ORDER — PHENAZOPYRIDINE HCL 100 MG PO TABS
95.0000 mg | ORAL_TABLET | Freq: Once | ORAL | Status: AC
  Administered 2023-08-16: 100 mg via ORAL
  Filled 2023-08-16: qty 1

## 2023-08-16 MED ORDER — LOSARTAN POTASSIUM 50 MG PO TABS: 50.0000 mg | ORAL_TABLET | Freq: Every day | ORAL | 2 refills | Status: AC

## 2023-08-16 MED ORDER — DOXYCYCLINE HYCLATE 100 MG PO TABS: 100.0000 mg | ORAL_TABLET | Freq: Two times a day (BID) | ORAL | 0 refills | Status: AC

## 2023-08-16 NOTE — ED Provider Notes (Addendum)
 [facility] Provider Note    Event Date/Time   First MD Initiated Contact with Patient 08/16/23 0121     (approximate)   History   Hematuria   HPI  Kevin Sanders is a 23 y.o. male with history of hypertension, diabetes who presents to the emergency department with gross hematuria, dysuria.  States symptoms started after having intercourse.  States he was able to ejaculate.  No discharge.  No injury to the penis.  No bruising, swelling noted to the penis or scrotum.  No testicular pain.  No abdominal pain, flank pain.  Not on blood thinners.  States he has had this happen to him previously and has been seen by urology and had a previous cystoscopy.  Reports recent negative STD screening.   History provided by patient.    Past Medical History:  Diagnosis Date   Diabetes ([facility])    Hypertension     Past Surgical History:  Procedure Laterality Date   none      MEDICATIONS:  Prior to Admission medications   Medication Sig Start Date End Date Taking? Authorizing Provider  acetaminophen  (TYLENOL ) 500 MG tablet Take 1,000 mg by mouth every 6 (six) hours as needed.    [provider]  albuterol  (VENTOLIN  HFA) 108 (90 Base) MCG/ACT inhaler Inhale 2 puffs into the lungs every 6 (six) hours as needed for wheezing or shortness of breath. 11/12/19   Singh, Prashant K, MD  amLODipine  (NORVASC ) 10 MG tablet Take 1 tablet (10 mg total) by mouth daily. 06/05/20   Shamleffer, Ibtehal Jaralla, MD  apixaban  (ELIQUIS ) 2.5 MG TABS tablet Take 1 tablet (2.5 mg total) by mouth 2 (two) times daily. 11/12/19   Singh, Prashant K, MD  blood glucose meter kit and supplies KIT Dispense based on patient and insurance preference. Use up to four times daily as directed. (FOR ICD-9 250.00, 250.01). 08/02/19   Margery Sheets B, MD  dapagliflozin  propanediol (FARXIGA ) 5 MG TABS tablet Take 1 tablet (5 mg total) by mouth daily before breakfast. 06/05/20   Shamleffer, Ibtehal  Jaralla, MD  losartan  (COZAAR ) 50 MG tablet Take 1 tablet (50 mg total) by mouth daily. 06/05/20   Shamleffer, Julian Obey, MD    Physical Exam   Triage Vital Signs: ED Triage Vitals  Encounter Vitals Group     BP 08/15/23 2336 (!) 175/134     Systolic BP Percentile --      Diastolic BP Percentile --      Pulse Rate 08/15/23 2335 92     Resp 08/15/23 2335 18     Temp 08/15/23 2335 98.4 F (36.9 C)     Temp Source 08/15/23 2335 Oral     SpO2 08/15/23 2335 99 %     Weight 08/15/23 2335 240 lb (108.9 kg)     Height 08/15/23 2335 5\' 10"  (1.778 m)     Head Circumference --      Peak Flow --      Pain Score --      Pain Loc --      Pain Education --      Exclude from Growth Chart --     Most recent vital signs: Vitals:   08/15/23 2336 08/16/23 0252  BP: (!) 175/134 (!) 160/106  Pulse:  89  Resp:  18  Temp:    SpO2:  99%    CONSTITUTIONAL: Alert, responds appropriately to questions. Well-appearing; well-nourished HEAD: Normocephalic, atraumatic EYES: Conjunctivae clear, pupils appear equal,  sclera nonicteric ENT: normal nose; moist mucous membranes NECK: Supple, normal ROM CARD: RRR; S1 and S2 appreciated RESP: Normal chest excursion without splinting or tachypnea; breath sounds clear and equal bilaterally; no wheezes, no rhonchi, no rales, no hypoxia or respiratory distress, speaking full sentences ABD/GI: Non-distended; soft, non-tender, no rebound, no guarding, no peritoneal signs GU: Penis, scrotum appear normal.  No bruising, swelling noted. BACK: The back appears normal EXT: Normal ROM in all joints; no deformity noted, no edema SKIN: Normal color for age and race; warm; no rash on exposed skin NEURO: Moves all extremities equally, normal speech PSYCH: The patient's mood and manner are appropriate.   ED Results / Procedures / Treatments   LABS: (all labs ordered are listed, but only abnormal results are displayed) Labs Reviewed  CHLAMYDIA/NGC RT PCR ([facility]  ONLY)           - Abnormal; Notable for the following components:      Result Value   Chlamydia Tr DETECTED (*)    All other components within normal limits  BASIC METABOLIC PANEL WITH GFR - Abnormal; Notable for the following components:   Glucose, Bld 172 (*)    All other components within normal limits  CBC - Abnormal; Notable for the following components:   RBC 6.56 (*)    MCV 75.5 (*)    MCH 25.5 (*)    All other components within normal limits  URINALYSIS, ROUTINE W REFLEX MICROSCOPIC - Abnormal; Notable for the following components:   Color, Urine YELLOW (*)    APPearance CLEAR (*)    Hgb urine dipstick MODERATE (*)    Protein, ur >=300 (*)    All other components within normal limits  URINE CULTURE     EKG:     RADIOLOGY: My personal review and interpretation of imaging:    I have personally reviewed all radiology reports.   No results found.   PROCEDURES:  Critical Care performed: No     Procedures    IMPRESSION / MDM / ASSESSMENT AND PLAN / ED COURSE  I reviewed the triage vital signs and the nursing notes.    Patient here with gross hematuria, dysuria.   DIFFERENTIAL DIAGNOSIS (includes but not limited to):   UTI, STI, urethritis, less likely kidney stone, malignancy   Patient's presentation is most consistent with acute complicated illness / injury requiring diagnostic workup.   PLAN: Labs obtained from triage show normal hemoglobin, creatinine.  No leukocytosis.  Patient has been able to urinate here.  Urine is yellow in color with small blood clot.  GU exam is normal.  No concern for penile fracture.  Will give Pyridium for discomfort, encourage oral fluids.  Patient was extremely hypertensive on arrival.  This could be secondary to anxiety.  Does have history of hypertension.  Will recheck blood pressure.   MEDICATIONS GIVEN IN ED: Medications  cephALEXin (KEFLEX) capsule 500 mg (500 mg Oral Given 08/16/23 0253)  phenazopyridine  (PYRIDIUM) tablet 100 mg (100 mg Oral Given 08/16/23 0253)     ED COURSE: Patient's urinalysis shows hematuria but no other sign of infection.  Culture is pending.  Given he does complain of some discomfort, we will start him on Keflex.  He denies any testicular pain or swelling.  Low suspicion clinically for orchitis or epididymitis.  He states he recently had negative STD screening and does not wish to be treated for gonorrhea, chlamydia at this time nor wait for these results to come back today.  Have encouraged him to follow-up with his urologist.  He verbalized understanding.  Patient is hypertensive here today as well.  History of the same.  Encouraged him to follow-up with a primary care doctor for this.  Referral has been placed.  He is asymptomatic.  Will refill his amlodipine  and losartan  that he was previously on.   At this time, I do not feel there is any life-threatening condition present. I reviewed all nursing notes, vitals, pertinent previous records.  All lab and urine results, EKGs, imaging ordered have been independently reviewed and interpreted by myself.  I reviewed all available radiology reports from any imaging ordered this visit.  Based on my assessment, I feel the patient is safe to be discharged home without further emergent workup and can continue workup as an outpatient as needed. Discussed all findings, treatment plan as well as usual and customary return precautions.  They verbalize understanding and are comfortable with this plan.  Outpatient follow-up has been provided as needed.  All questions have been answered.   3:37 AM  Pt's chlamydia test has come back positive.  I was able to contact him by phone and notify him of these abnormal results.  Advised him to hold on the Keflex and we will start him on doxycycline.  Recommended that any recent partners to be tested, treated as well.  Gonorrhea and is negative today.  Patient verbalized understanding.  Appreciative of the  phone call.   CONSULTS:  none   OUTSIDE RECORDS REVIEWED: Reviewed patient's previous urology records.  Patient diagnosed with hematospermia and had negative cystoscopy in October 2023.       FINAL CLINICAL IMPRESSION(S) / ED DIAGNOSES   Final diagnoses:  Gross hematuria  Uncontrolled hypertension  Chlamydia     Rx / DC Orders   ED Discharge Orders          Ordered    cephALEXin (KEFLEX) 500 MG capsule  2 times daily        08/16/23 0300    phenazopyridine (PYRIDIUM) 200 MG tablet  3 times daily PRN        08/16/23 0300    Ambulatory Referral to Primary Care (Establish Care)        08/16/23 0324    amLODipine  (NORVASC ) 10 MG tablet  Daily        08/16/23 0326    phenazopyridine (PYRIDIUM) 100 MG tablet  3 times daily PRN        08/16/23 0326    losartan  (COZAAR ) 50 MG tablet  Daily        08/16/23 0326    doxycycline (VIBRA-TABS) 100 MG tablet  2 times daily        08/16/23 1610             Note:  This document was prepared using Dragon voice recognition software and may include unintentional dictation errors.   Araina Butrick, Clover Dao, DO 08/16/23 0326    Cattleya Dobratz, Clover Dao, DO 08/16/23 [phone]

## 2023-08-16 NOTE — Discharge Instructions (Addendum)
You may alternate Tylenol 1000 mg every 6 hours as needed for pain, fever and Ibuprofen 800 mg every 6-8 hours as needed for pain, fever.  Please take Ibuprofen with food.  Do not take more than 4000 mg of Tylenol (acetaminophen) in a 24 hour period. ° °

## 2023-08-17 LAB — URINE CULTURE: Culture: NO GROWTH

## 2023-08-23 ENCOUNTER — Other Ambulatory Visit: Payer: Self-pay

## 2023-08-23 ENCOUNTER — Encounter (HOSPITAL_COMMUNITY): Payer: Self-pay | Admitting: *Deleted

## 2023-08-23 ENCOUNTER — Emergency Department (HOSPITAL_COMMUNITY)

## 2023-08-23 ENCOUNTER — Emergency Department (HOSPITAL_COMMUNITY)
Admission: EM | Admit: 2023-08-23 | Discharge: 2023-08-23 | Disposition: A | Discharge status: Home / Self Care | Attending: Emergency Medicine | Admitting: Emergency Medicine

## 2023-08-23 DIAGNOSIS — E119 Type 2 diabetes mellitus without complications: Secondary | ICD-10-CM | POA: Insufficient documentation

## 2023-08-23 DIAGNOSIS — I10 Essential (primary) hypertension: Secondary | ICD-10-CM | POA: Diagnosis not present

## 2023-08-23 DIAGNOSIS — R319 Hematuria, unspecified: Secondary | ICD-10-CM | POA: Diagnosis present

## 2023-08-23 DIAGNOSIS — R361 Hematospermia: Secondary | ICD-10-CM | POA: Diagnosis not present

## 2023-08-23 DIAGNOSIS — N3001 Acute cystitis with hematuria: Secondary | ICD-10-CM | POA: Insufficient documentation

## 2023-08-23 DIAGNOSIS — N3090 Cystitis, unspecified without hematuria: Secondary | ICD-10-CM

## 2023-08-23 LAB — URINALYSIS, ROUTINE W REFLEX MICROSCOPIC
Bilirubin Urine: NEGATIVE
Glucose, UA: 50 mg/dL — AB
Ketones, ur: 5 mg/dL — AB
Leukocytes,Ua: NEGATIVE
Nitrite: POSITIVE — AB
Protein, ur: 100 mg/dL — AB
Specific Gravity, Urine: 1.016 (ref 1.005–1.030)
pH: 5 (ref 5.0–8.0)

## 2023-08-23 LAB — CBC
HCT: 48.4 % (ref 39.0–52.0)
Hemoglobin: 16.1 g/dL (ref 13.0–17.0)
MCH: 25.5 pg — ABNORMAL LOW (ref 26.0–34.0)
MCHC: 33.3 g/dL (ref 30.0–36.0)
MCV: 76.7 fL — ABNORMAL LOW (ref 80.0–100.0)
Platelets: 273 10*3/uL (ref 150–400)
RBC: 6.31 MIL/uL — ABNORMAL HIGH (ref 4.22–5.81)
RDW: 13.6 % (ref 11.5–15.5)
WBC: 7.3 10*3/uL (ref 4.0–10.5)
nRBC: 0 % (ref 0.0–0.2)

## 2023-08-23 LAB — COMPREHENSIVE METABOLIC PANEL WITH GFR
ALT: 13 U/L (ref 0–44)
AST: 18 U/L (ref 15–41)
Albumin: 3.9 g/dL (ref 3.5–5.0)
Alkaline Phosphatase: 40 U/L (ref 38–126)
Anion gap: 7 (ref 5–15)
BUN: 10 mg/dL (ref 6–20)
CO2: 28 mmol/L (ref 22–32)
Calcium: 9.2 mg/dL (ref 8.9–10.3)
Chloride: 106 mmol/L (ref 98–111)
Creatinine, Ser: 1.08 mg/dL (ref 0.61–1.24)
GFR, Estimated: >60 mL/min (ref >60)
Glucose, Bld: 151 mg/dL — ABNORMAL HIGH (ref 70–99)
Potassium: 4.1 mmol/L (ref 3.5–5.1)
Sodium: 141 mmol/L (ref 135–145)
Total Bilirubin: 0.5 mg/dL (ref 0.0–1.2)
Total Protein: 6.6 g/dL (ref 6.5–8.1)

## 2023-08-23 LAB — LIPASE, BLOOD: Lipase: 28 U/L (ref 11–51)

## 2023-08-23 MED ORDER — SULFAMETHOXAZOLE-TRIMETHOPRIM 800-160 MG PO TABS
1.0000 | ORAL_TABLET | Freq: Two times a day (BID) | ORAL | 0 refills | Status: AC
Start: 2023-08-23 — End: 2023-09-02

## 2023-08-23 NOTE — Discharge Instructions (Addendum)
 Please continue all medications as previously prescribed.  Please take Bactrim  2 times a day for the next 10 days.  Please follow-up with urology.  If you experience urinary retention please return to the ED.  You may take Tylenol  or Profen as needed for pain Thank you for allowing us  to take care of you today.  We hope you begin feeling better soon.   To-Do:  Please follow-up with your primary doctor within the next 2-3 days. Please return to the Emergency Department or call 911 if you experience chest pain, shortness of breath, severe pain, severe fever, altered mental status, or have any reason to think that you need emergency medical care.  Thank you again.  Hope you feel better soon.  [facility] Department of Emergency Medicine

## 2023-08-23 NOTE — ED Provider Notes (Signed)
 [place] EMERGENCY DEPARTMENT AT [facility] Provider Note   CSN: [id number] Arrival date & time: 08/23/23  1649     History Chief Complaint  Patient presents with   Hematuria    Kevin Sanders is a 23 y.o. male w/ PMHx hypertension, diabetes, noncompliance of medication, hematospermia, who presents to the ED for evaluation of hematuria and hematospermia.  Patient reports he is currently finishing treatment for chlamydia.  Patient states last Wednesday he had episode of hematuria following sexual intercourse.  He reports today after having an erection he began having blood dripping from urethral meatus.  Patient also reports testicular pain with episodes of hematuria in the past per minute.  Patient denies any fevers chills.  No lower back pain nausea vomiting.  No testicular swelling.  No rashes. Follows with urology, f/u end of June     Physical Exam Updated Vital Signs BP (!) 164/107   Pulse 83   Temp 98.3 F (36.8 C)   Resp 18   Ht 5' 10 (1.778 m)   Wt 108.9 kg   SpO2 99%   BMI 34.45 kg/m  Physical Exam Vitals and nursing note reviewed.  Constitutional:      General: He is not in acute distress.    Appearance: He is well-developed.   Cardiovascular:     Rate and Rhythm: Normal rate.  Pulmonary:     Effort: Pulmonary effort is normal.  Abdominal:     General: Abdomen is flat.     Palpations: Abdomen is soft.     Tenderness: There is no abdominal tenderness. There is no right CVA tenderness or left CVA tenderness.  Genitourinary:    Penis: Normal and circumcised.      Testes: Normal. Cremasteric reflex is present.        Right: Mass, tenderness or swelling not present.        Left: Mass or swelling not present.   Musculoskeletal:        General: No swelling.     Cervical back: Neck supple.   Skin:    General: Skin is warm and dry.     Capillary Refill: Capillary refill takes less than 2 seconds.   Neurological:     Mental Status: He is  alert.     ED Results / Procedures / Treatments   Labs (all labs ordered are listed, but only abnormal results are displayed) Labs Reviewed  COMPREHENSIVE METABOLIC PANEL WITH GFR - Abnormal; Notable for the following components:      Result Value   Glucose, Bld 151 (*)    All other components within normal limits  CBC - Abnormal; Notable for the following components:   RBC 6.31 (*)    MCV 76.7 (*)    MCH 25.5 (*)    All other components within normal limits  URINALYSIS, ROUTINE W REFLEX MICROSCOPIC - Abnormal; Notable for the following components:   Color, Urine RED (*)    APPearance CLOUDY (*)    Glucose, UA 50 (*)    Hgb urine dipstick MODERATE (*)    Ketones, ur 5 (*)    Protein, ur 100 (*)    Nitrite POSITIVE (*)    Bacteria, UA PRESENT (*)    All other components within normal limits  LIPASE, BLOOD    EKG None  Radiology CT Renal Stone Study Result Date: 08/23/2023 CLINICAL DATA:  Abdominal/flank pain, stone suspected EXAM: CT ABDOMEN AND PELVIS WITHOUT CONTRAST TECHNIQUE: Multidetector CT imaging of the abdomen  and pelvis was performed following the standard protocol without IV contrast. RADIATION DOSE REDUCTION: This exam was performed according to the departmental dose-optimization program which includes automated exposure control, adjustment of the mA and/or kV according to patient size and/or use of iterative reconstruction technique. COMPARISON:  CT renal 01/17/2017 FINDINGS: Lower chest: No acute abnormality. Hepatobiliary: No focal liver abnormality. No gallstones, gallbladder wall thickening, or pericholecystic fluid. No biliary dilatation. Pancreas: No focal lesion. Normal pancreatic contour. No surrounding inflammatory changes. No main pancreatic ductal dilatation. Spleen: Normal in size without focal abnormality.  Splenules noted. Adrenals/Urinary Tract: No adrenal nodule bilaterally. No nephrolithiasis and no hydronephrosis. No definite contour-deforming renal  mass. No ureterolithiasis or hydroureter. The urinary bladder is unremarkable. Stomach/Bowel: Stomach is within normal limits. No evidence of bowel wall thickening or dilatation. Colonic diverticulosis. Appendix appears normal. Vascular/Lymphatic: No abdominal aorta or iliac aneurysm. No abdominal, pelvic, or inguinal lymphadenopathy. Reproductive: Prostate is unremarkable. Other: No intraperitoneal free fluid. No intraperitoneal free gas. No organized fluid collection. Musculoskeletal: No abdominal wall hernia or abnormality. No suspicious lytic or blastic osseous lesions. No acute displaced fracture. IMPRESSION: 1. Colonic diverticulosis with no acute diverticulitis. 2. No acute intra-abdominal or intrapelvic abnormality with limited evaluation on this noncontrast study. Electronically Signed   By: Morgane  Naveau M.D.   On: 08/23/2023 19:40   US  SCROTUM W/DOPPLER Result Date: 08/23/2023 CLINICAL DATA:  Scrotal pain, hematospermia EXAM: SCROTAL ULTRASOUND DOPPLER ULTRASOUND OF THE TESTICLES TECHNIQUE: Complete ultrasound examination of the testicles, epididymis, and other scrotal structures was performed. Color and spectral Doppler ultrasound were also utilized to evaluate blood flow to the testicles. COMPARISON:  Scrotal ultrasound 08/04/2019 FINDINGS: Right testicle Measurements: 4.3 x 2.2 x 2.8 cm. No mass or microlithiasis visualized. Right mild relative hyperemia of the right testicle compared to the left. Left testicle Measurements: 4.4 x 2.0 x 2.6 cm. No mass or microlithiasis visualized. Right epididymis:  Normal in size and appearance. Left epididymis:  Normal in size and appearance. Hydrocele:  Small bilateral hydroceles. Varicocele:  None visualized. Pulsed Doppler interrogation of both testes demonstrates normal low resistance arterial and venous waveforms bilaterally. IMPRESSION: 1. Mild relative hyperemia of the right testicle compared to the left. This is favored normal variation however orchitis  is could appear similarly. Electronically Signed   By: Rozell Cornet M.D.   On: 08/23/2023 19:37    Medications Ordered in ED Medications - No data to display  ED Course/ Medical Decision Making/ A&P  Kevin Sanders is a 23 y.o. male presents as detailed above  Differential ddx: UTI, STD, epididymitis, testicular abnormality, urethral injury, prostatitis,  On arrival, patient afebrile hemodynamically stable no hypoxia or respiratory distress.  No acute abnormality on GU exam  ED Work-up: Please see details of labs and imaging listed above. Urinalysis consistent with infection.  CT without acute intra-abdominal abnormality or nephrolithiasis. Testicular ultrasound with mild relative hyperemia of right testicle.  This could represent orchitis versus normal variation. Patient without urinary retention in ED.  Patient to schedule follow-up with urology.  Will place on 10-day course of Bactrim  for possible orchitis UTI and urethritis.   Overall impression hematuria, hematospermia   Patient stable for discharge and outpatient follow-up. Strict return precautions provided. Patient voices understanding and agrees with plan.   Patient seen with supervising physician who agrees with plan.  Final Clinical Impression(s) / ED Diagnoses Final diagnoses:  Hematuria, unspecified type  Hematospermia  Cystitis    Rx / DC Orders ED Discharge Orders  Ordered    sulfamethoxazole -trimethoprim  (BACTRIM  DS) 800-160 MG tablet  2 times daily        08/23/23 1947            Angele Keller, DO PGY-3 Emergency Medicine    Angele Keller, DO 08/23/23 2300    Lowery Rue, DO 08/23/23 2305

## 2023-08-23 NOTE — ED Provider Notes (Signed)
 Supervised resident visit.  Patient here hematuria.  Is currently on doxycycline  for chlamydia.  Just finished some antibiotics for possible UTI.  He has history of hematuria in the past.  This occurs with ejaculation usually in the past.  He has had extensive workup with urology in the past she has had cystoscopy.  He is not having any urinary retention.  He is passing some blood clots.  But he has been able to make urine here without any major issues.  He is not having any abdominal pain or flank pain.  He does have gross hematuria on exam.  His lab work was unremarkable.  Urinalysis may be questionable for infection but there is a lot of blood in his urine.  He has no significant anemia leukocytosis or electrolyte abnormality.  CT scan of the abdomen and pelvis was unremarkable.  Testicular ultrasound shows may be some orchitis/hyperemia of the right testicle.  Overall we will have him finish doxycycline .  Will treat him with Bactrim  for 2 weeks for maybe some prostate inflammation, UTI.  He has follow-up with urology already in place.  At this time there is no need for admission or other emergent workup.  We educated him about returning for urinary retention.  Discharged in good condition.  Understands return precautions.  This chart was dictated using voice recognition software.  Despite best efforts to proofread,  errors can occur which can change the documentation meaning.    Kevin Rue, DO 08/23/23 1947

## 2023-08-23 NOTE — ED Notes (Signed)
 Patient transported to Ultrasound

## 2023-08-23 NOTE — ED Triage Notes (Signed)
 The pt is having bloody urine today with clots  it started since Wednesday he is currently being treated for a std

## 2023-09-01 ENCOUNTER — Ambulatory Visit (INDEPENDENT_AMBULATORY_CARE_PROVIDER_SITE_OTHER): Payer: Self-pay | Admitting: Physician Assistant

## 2023-09-01 VITALS — BP 166/126 | HR 74 | Ht 69.0 in | Wt 242.1 lb

## 2023-09-01 DIAGNOSIS — R361 Hematospermia: Secondary | ICD-10-CM

## 2023-09-01 DIAGNOSIS — R310 Gross hematuria: Secondary | ICD-10-CM

## 2023-09-01 DIAGNOSIS — I10 Essential (primary) hypertension: Secondary | ICD-10-CM

## 2023-09-01 LAB — URINALYSIS, COMPLETE
Bilirubin, UA: NEGATIVE
Ketones, UA: NEGATIVE
Leukocytes,UA: NEGATIVE
Nitrite, UA: NEGATIVE
Specific Gravity, UA: 1.03 (ref 1.005–1.030)
Urobilinogen, Ur: 0.2 mg/dL (ref 0.2–1.0)
pH, UA: 6 (ref 5.0–7.5)

## 2023-09-01 LAB — MICROSCOPIC EXAMINATION

## 2023-09-05 NOTE — Progress Notes (Signed)
 09/01/2023 4:19 PM   Kevin Sanders 07/14/2000 [id number]  CC: Chief Complaint  Patient presents with   Hematuria   HPI: Kevin Sanders is a 23 y.o. male with PMH diabetes, hypertension, chronic testicular pain, hematospermia, and gross hematuria with negative cystoscopy in 2023 who presents today for evaluation of recurrent hematospermia and gross hematuria.   Today he reports his gross hematuria and hematospermia resolved in late 2023, but resumed earlier this month.  He describes gross hematuria with clots occurring after sex and sometimes associated with hematospermia.  He will have pain when he passes the clots, but not true dysuria.  He will have pain in his penis and testicles when this happens.  He went to the ED twice this month for this problem.  The first time, chlamydia testing was positive and he completed 10 days of doxycycline  followed by 10 days of Bactrim .  He had episodes of gross hematuria/hematospermia as above after completing the doxycycline .  He reports noncompliance with his antihypertensives for about the past year.  In-office UA today positive for 3+ glucose, 2+ protein, and trace intact blood; urine microscopy pan negative.  PMH: Past Medical History:  Diagnosis Date   Diabetes (HCC)    Hypertension     Surgical History: Past Surgical History:  Procedure Laterality Date   none      Home Medications:  Allergies as of 09/01/2023       Reactions   Augmentin [amoxicillin-pot Clavulanate]         Medication List        Accurate as of September 01, 2023 11:59 PM. If you have any questions, ask your nurse or doctor.          STOP taking these medications    acetaminophen  500 MG tablet Commonly known as: TYLENOL  Stopped by: Maisa Bedingfield   albuterol  108 (90 Base) MCG/ACT inhaler Commonly known as: VENTOLIN  HFA Stopped by: Raine Elsass   apixaban  2.5 MG Tabs tablet Commonly known as: Eliquis  Stopped by: Lucie Hones   blood glucose meter kit and supplies Kit Stopped by: Lucie Hones   cephALEXin  500 MG capsule Commonly known as: KEFLEX  Stopped by: Lucie Hones   dapagliflozin  propanediol 5 MG Tabs tablet Commonly known as: Farxiga  Stopped by: Lucie Hones   phenazopyridine  100 MG tablet Commonly known as: PYRIDIUM  Stopped by: Lucie Hones   phenazopyridine  200 MG tablet Commonly known as: Pyridium  Stopped by: Lucie Hones       TAKE these medications    amLODipine  10 MG tablet Commonly known as: NORVASC  Take 1 tablet (10 mg total) by mouth daily.   losartan  50 MG tablet Commonly known as: Cozaar  Take 1 tablet (50 mg total) by mouth daily.   sulfamethoxazole -trimethoprim  800-160 MG tablet Commonly known as: BACTRIM  DS Take 1 tablet by mouth 2 (two) times daily for 10 days.        Allergies:  Allergies  Allergen Reactions   Augmentin [Amoxicillin-Pot Clavulanate]     Family History: Family History  Problem Relation Age of Onset   Bladder Cancer Neg Hx    Kidney cancer Neg Hx    Prostate cancer Neg Hx     Social History:   reports that he has never smoked. He has never used smokeless tobacco. He reports that he does not drink alcohol and does not use drugs.  Physical Exam: BP (!) 166/126   Pulse 74   Ht 5' 9 (1.753 m)   Wt 242 lb 1 oz (  109.8 kg)   BMI 35.75 kg/m   Constitutional:  Alert and oriented, no acute distress, nontoxic appearing HEENT: [place], AT Cardiovascular: No clubbing, cyanosis, or edema Respiratory: Normal respiratory effort, no increased work of breathing Skin: No rashes, bruises or suspicious lesions Neurologic: Grossly intact, no focal deficits, moving all 4 extremities Psychiatric: Normal mood and affect  Laboratory Data: Results for orders placed or performed in visit on 09/01/23  Microscopic Examination   Collection Time: 09/01/23  2:47 PM   Urine  Result Value Ref Range    WBC, UA 0-5 0 - 5 /hpf   RBC, Urine 0-2 0 - 2 /hpf   Epithelial Cells (non renal) 0-10 0 - 10 /hpf   Mucus, UA Present (A) Not Estab.   Bacteria, UA Few None seen/Few  Urinalysis, Complete   Collection Time: 09/01/23  2:47 PM  Result Value Ref Range   Specific Gravity, UA 1.030 1.005 - 1.030   pH, UA 6.0 5.0 - 7.5   Color, UA Yellow Yellow   Appearance Ur Clear Clear   Leukocytes,UA Negative Negative   Protein,UA 2+ (A) Negative/Trace   Glucose, UA 3+ (A) Negative   Ketones, UA Negative Negative   RBC, UA Trace (A) Negative   Bilirubin, UA Negative Negative   Urobilinogen, Ur 0.2 0.2 - 1.0 mg/dL   Nitrite, UA Negative Negative   Microscopic Examination See below:    Assessment & Plan:   1. Gross hematuria (Primary) Resolved, UA bland today.  I think the timing of his gross hematuria episodes immediately after sex is interesting, and I suspect this is related to #2 below.  Will defer cystoscopy in light of MRI as below. - Urinalysis, Complete  2. 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  The symptoms have persisted despite appropriate treatment for his chlamydia.  I recommended MR pelvis for further evaluation and will have him follow-up with Dr. Twylla.  He would benefit from repeat STI testing at that visit to ensure the infection is cleared. - MR Pelvis W Wo Contrast; Future  3. Essential hypertension His BP was significantly elevated today.  We discussed the risks of uncontrolled hypertension specifically to his renal function over time.  Given his young age, I urged him to return to his PCP and resume antihypertensive therapy.  Return in about 4 weeks (around 09/29/2023) for MRI results with Dr. Twylla.  Lucie Hones, PA-C  [facility] [address], [address] [place], [place] [address] [phone]

## 2023-09-23 ENCOUNTER — Ambulatory Visit: Admission: RE | Admit: 2023-09-23 | Source: Ambulatory Visit

## 2023-09-24 ENCOUNTER — Ambulatory Visit: Admitting: Urology

## 2023-09-25 ENCOUNTER — Ambulatory Visit
Admission: RE | Admit: 2023-09-25 | Discharge: 2023-09-25 | Disposition: A | Discharge status: Home / Self Care | Source: Ambulatory Visit | Attending: Physician Assistant | Admitting: Physician Assistant

## 2023-09-25 DIAGNOSIS — R361 Hematospermia: Secondary | ICD-10-CM | POA: Diagnosis present

## 2023-09-25 MED ORDER — GADOBUTROL 1 MMOL/ML IV SOLN
10.0000 mL | Freq: Once | INTRAVENOUS | Status: AC | PRN
  Administered 2023-09-25: 10 mL via INTRAVENOUS

## 2023-09-29 ENCOUNTER — Ambulatory Visit: Admitting: Urology

## 2023-10-14 ENCOUNTER — Ambulatory Visit (INDEPENDENT_AMBULATORY_CARE_PROVIDER_SITE_OTHER): Admitting: Urology

## 2023-10-14 VITALS — BP 146/89 | HR 87 | Ht 69.0 in | Wt 240.0 lb

## 2023-10-14 DIAGNOSIS — R361 Hematospermia: Secondary | ICD-10-CM | POA: Diagnosis not present

## 2023-10-14 NOTE — Progress Notes (Signed)
   10/14/2023 12:22 PM   Kevin Sanders 2001/01/15 [id number]   Chief Complaint  Patient presents with   Medical Management of Chronic Issues    HPI: Kevin Sanders is a 23 y.o. male who presents for follow-up.  Refer to Dr. Bjorn previous cystoscopy note 12/19/2021 and Kevin Sanders's previous office note 09/01/2023. MRI pelvis showed no significant abnormalities of the prostate or seminal vesicles He has not ejaculated since his last visit.  No voiding symptoms or gross hematuria   PMH: Past Medical History:  Diagnosis Date   Diabetes (HCC)    Hypertension     Surgical History: Past Surgical History:  Procedure Laterality Date   none      Home Medications:  Allergies as of 10/14/2023       Reactions   Augmentin [amoxicillin-pot Clavulanate]         Medication List        Accurate as of October 14, 2023 12:22 PM. If you have any questions, ask your nurse or doctor.          amLODipine  10 MG tablet Commonly known as: NORVASC  Take 1 tablet (10 mg total) by mouth daily.   losartan  50 MG tablet Commonly known as: Cozaar  Take 1 tablet (50 mg total) by mouth daily.        Allergies:  Allergies  Allergen Reactions   Augmentin [Amoxicillin-Pot Clavulanate]     Family History: Family History  Problem Relation Age of Onset   Bladder Cancer Neg Hx    Kidney cancer Neg Hx    Prostate cancer Neg Hx     Social History:  reports that he has never smoked. He has never used smokeless tobacco. He reports that he does not drink alcohol and does not use drugs.   Physical Exam: BP (!) 146/89   Pulse 87   Ht 5' 9 (1.753 m)   Wt 240 lb (108.9 kg)   BMI 35.44 kg/m   Constitutional:  Alert and oriented, No acute distress. HEENT: [place] AT Respiratory: Normal respiratory effort, no increased work of breathing. Psychiatric: Normal mood and affect.   Assessment & Plan:   Hematospermia with significant post ejaculatory hematuria No abnormalities  noted on cystoscopy, CT and recent pelvic MRI He has requested a second opinion and will have him see Dr. Lovie in [place] JAYSON Twylla, MD  [facility] [address] [place], [place] [address] [phone]

## 2023-10-20 ENCOUNTER — Other Ambulatory Visit: Payer: Self-pay

## 2023-10-20 ENCOUNTER — Other Ambulatory Visit

## 2023-10-20 DIAGNOSIS — I10 Essential (primary) hypertension: Secondary | ICD-10-CM

## 2023-11-04 ENCOUNTER — Emergency Department (HOSPITAL_BASED_OUTPATIENT_CLINIC_OR_DEPARTMENT_OTHER)
Admission: EM | Admit: 2023-11-04 | Discharge: 2023-11-04 | Disposition: A | Discharge status: Home / Self Care | Attending: Emergency Medicine | Admitting: Emergency Medicine

## 2023-11-04 ENCOUNTER — Other Ambulatory Visit: Payer: Self-pay

## 2023-11-04 ENCOUNTER — Emergency Department (HOSPITAL_BASED_OUTPATIENT_CLINIC_OR_DEPARTMENT_OTHER): Admitting: Radiology

## 2023-11-04 DIAGNOSIS — I10 Essential (primary) hypertension: Secondary | ICD-10-CM | POA: Diagnosis not present

## 2023-11-04 DIAGNOSIS — Z79899 Other long term (current) drug therapy: Secondary | ICD-10-CM | POA: Diagnosis not present

## 2023-11-04 DIAGNOSIS — R030 Elevated blood-pressure reading, without diagnosis of hypertension: Secondary | ICD-10-CM

## 2023-11-04 DIAGNOSIS — X58XXXA Exposure to other specified factors, initial encounter: Secondary | ICD-10-CM | POA: Insufficient documentation

## 2023-11-04 DIAGNOSIS — S76301A Unspecified injury of muscle, fascia and tendon of the posterior muscle group at thigh level, right thigh, initial encounter: Secondary | ICD-10-CM

## 2023-11-04 DIAGNOSIS — S79921A Unspecified injury of right thigh, initial encounter: Secondary | ICD-10-CM | POA: Diagnosis present

## 2023-11-04 MED ORDER — IBUPROFEN 800 MG PO TABS
800.0000 mg | ORAL_TABLET | Freq: Once | ORAL | Status: DC
  Filled 2023-11-04: qty 1

## 2023-11-04 MED ORDER — NAPROXEN 500 MG PO TABS: 500.0000 mg | ORAL_TABLET | Freq: Two times a day (BID) | ORAL | 0 refills | Status: AC | PRN

## 2023-11-04 NOTE — ED Notes (Signed)
Pt refused Ibuprofen

## 2023-11-04 NOTE — Discharge Instructions (Signed)
 Your x-ray is negative.  Take anti-inflammatories and follow-up with orthopedic doctor for an MRI.  Your blood pressure today is elevated and you should follow-up with your primary doctor regarding this.  Return to the ED with new or worsening symptoms.

## 2023-11-04 NOTE — ED Triage Notes (Signed)
 Pt POV reporting L hamstring pain after playing with dog, ambulatory at this time, no fall.

## 2023-11-04 NOTE — ED Provider Notes (Signed)
 [place] EMERGENCY DEPARTMENT AT [facility] Provider Note   CSN: [id number] Arrival date & time: 11/04/23  9962     Patient presents with: Leg Pain   Kevin Sanders is a 23 y.o. male.   Patient reports injury to right thigh and knee.  He was playing kickball about a month ago and believes he damaged his hamstring.  This seemed to improve over several weeks and then he was jumped on by his dog about a week ago and has increased pain to his right posterior thigh and knee.  Not take anything at home.  Denies falling.  No head injury.  No focal weakness, numbness or tingling.  No chest pain or shortness of breath.  History of hypertension.   Leg Pain Associated symptoms: no fever        Prior to Admission medications   Medication Sig Start Date End Date Taking? Authorizing Provider  amLODipine  (NORVASC ) 10 MG tablet Take 1 tablet (10 mg total) by mouth daily. 06/05/20   Shamleffer, Ibtehal Jaralla, MD  losartan  (COZAAR ) 50 MG tablet Take 1 tablet (50 mg total) by mouth daily. 08/16/23 08/15/24  Ward, Josette SAILOR, DO    Allergies: Augmentin [amoxicillin-pot clavulanate]    Review of Systems  Constitutional:  Negative for activity change, appetite change and fever.  HENT:  Negative for congestion and rhinorrhea.   Respiratory:  Negative for cough and chest tightness.   Cardiovascular:  Negative for chest pain.  Gastrointestinal:  Negative for abdominal pain, nausea and vomiting.  Genitourinary:  Negative for dysuria and hematuria.  Musculoskeletal:  Positive for arthralgias and myalgias.  Skin:  Negative for rash.  Neurological:  Negative for dizziness, weakness and headaches.   all other systems are negative except as noted in the HPI and PMH.    Updated Vital Signs BP (!) 171/100   Pulse 100   Temp 98.3 F (36.8 C) (Temporal)   Resp 19   SpO2 96%   Physical Exam Vitals and nursing note reviewed.  Constitutional:      General: He is not in acute distress.     Appearance: He is well-developed.  HENT:     Head: Normocephalic and atraumatic.     Mouth/Throat:     Pharynx: No oropharyngeal exudate.  Eyes:     Conjunctiva/sclera: Conjunctivae normal.     Pupils: Pupils are equal, round, and reactive to light.  Neck:     Comments: No meningismus. Cardiovascular:     Rate and Rhythm: Normal rate and regular rhythm.     Heart sounds: Normal heart sounds. No murmur heard. Pulmonary:     Effort: Pulmonary effort is normal. No respiratory distress.     Breath sounds: Normal breath sounds.  Abdominal:     Palpations: Abdomen is soft.     Tenderness: There is no abdominal tenderness. There is no guarding or rebound.  Musculoskeletal:        General: Swelling and tenderness present.     Cervical back: Normal range of motion and neck supple.     Comments: Tenderness and ecchymosis to right posterior thigh and knee.  Full range of motion of right hip and knee.  Compartments are soft.  Intact DP and PT pulse.  Skin:    General: Skin is warm.  Neurological:     Mental Status: He is alert and oriented to person, place, and time.     Cranial Nerves: No cranial nerve deficit.     Motor: No abnormal  muscle tone.     Coordination: Coordination normal.     Comments:  5/5 strength throughout. CN 2-12 intact.Equal grip strength.   Psychiatric:        Behavior: Behavior normal.     (all labs ordered are listed, but only abnormal results are displayed) Labs Reviewed - No data to display  EKG: None  Radiology: DG Knee Complete 4 Views Right Result Date: 11/04/2023 EXAM: 4 VIEW(S) XRAY OF THE KNEE 11/04/2023 04:36:34 AM COMPARISON: None available. CLINICAL HISTORY: Knee pain. Pt POV reporting L hamstring pain after playing with dog, ambulatory at this time, no fall. FINDINGS: BONES AND JOINTS: No acute fracture. No focal osseous lesion. No joint dislocation. No significant joint effusion. No significant degenerative changes. SOFT TISSUES: The soft tissues  are unremarkable. IMPRESSION: 1. No acute findings. Electronically signed by: Waddell Calk MD 11/04/2023 05:50 AM EDT RP Workstation: [id number]     Procedures   Medications Ordered in the ED - No data to display                                  Medical Decision Making Amount and/or Complexity of Data Reviewed Labs: ordered. Decision-making details documented in ED Course. Radiology: ordered and independent interpretation performed. Decision-making details documented in ED Course. ECG/medicine tests: ordered and independent interpretation performed. Decision-making details documented in ED Course.  Risk Prescription drug management.   Right leg injury.  Neurovascular intact ecchymosis to the right posterior thigh.  No bony tenderness  X-ray negative for fracture.  The compartments are soft.  Results reviewed and interpreted by me.  Suspect likely muscle or tendon tear.  Also consider sprain or strain.  No evidence of compartment syndrome currently.  Blood pressure elevated.  He states he has not had his blood pressure medication for more than a year.  He is asymptomatic from this.  Denies any headache, vision changes, chest pain, shortness of breath.  Recommend ice, elevation, anti-inflammatories, knee brace and orthopedic follow-up for likely MRI. Discussed importance of taking his blood pressure medication to prevent long-term damage to heart, brain, lungs, eyes, kidneys. Return precautions discussed     Final diagnoses:  None    ED Discharge Orders     None          Chadwin Fury, Garnette, MD 11/04/23 [phone]

## 2024-02-06 ENCOUNTER — Ambulatory Visit (HOSPITAL_COMMUNITY)
Admission: EM | Admit: 2024-02-06 | Discharge: 2024-02-06 | Disposition: A | Discharge status: Home / Self Care | Attending: Emergency Medicine | Admitting: Emergency Medicine

## 2024-02-06 ENCOUNTER — Encounter (HOSPITAL_COMMUNITY): Payer: Self-pay | Admitting: Emergency Medicine

## 2024-02-06 DIAGNOSIS — J019 Acute sinusitis, unspecified: Secondary | ICD-10-CM | POA: Diagnosis not present

## 2024-02-06 DIAGNOSIS — J029 Acute pharyngitis, unspecified: Secondary | ICD-10-CM | POA: Diagnosis not present

## 2024-02-06 DIAGNOSIS — R051 Acute cough: Secondary | ICD-10-CM

## 2024-02-06 DIAGNOSIS — B9689 Other specified bacterial agents as the cause of diseases classified elsewhere: Secondary | ICD-10-CM

## 2024-02-06 LAB — POCT RAPID STREP A (OFFICE): Rapid Strep A Screen: NEGATIVE

## 2024-02-06 MED ORDER — DOXYCYCLINE HYCLATE 100 MG PO CAPS: 100.0000 mg | ORAL_CAPSULE | Freq: Two times a day (BID) | ORAL | 0 refills | Status: AC

## 2024-02-06 MED ORDER — PROMETHAZINE-DM 6.25-15 MG/5ML PO SYRP: 5.0000 mL | ORAL_SOLUTION | Freq: Every evening | ORAL | 0 refills | Status: AC | PRN

## 2024-02-06 MED ORDER — AZELASTINE HCL 0.1 % NA SOLN: 2.0000 | Freq: Two times a day (BID) | NASAL | 0 refills | Status: AC

## 2024-02-06 NOTE — ED Provider Notes (Signed)
 [facility]    CSN: [id number] Arrival date & time: 02/06/24  1140      History   Chief Complaint Chief Complaint  Patient presents with   Sore Throat   Otalgia    HPI Kevin Sanders is a 23 y.o. male.   Patient presents with persistent sore throat, nasal congestion, cough, ear pain, and sinus pressure that began 11/22.  Patient states that he has also been having intermittent sweats over the last few days as well.  Patient states that at times he has some difficulty breathing through his nose.    Denies fever, shortness of breath, chest pain, nausea, vomiting, diarrhea, and abdominal pain.  Patient states he has been taking DayQuil, NyQuil, and Alka-Seltzer with minimal relief.  Patient denies any known sick exposures.  Of note patient has had history of allergic reaction to Augmentin, but is unsure what his previous reaction to this was.  The history is provided by the patient and medical records.  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 Authorizing Provider  azelastine (ASTELIN) 0.1 % nasal spray Place 2 sprays into both nostrils 2 (two) times daily. Use in each nostril as directed 02/06/24  Yes Johnie Flaming A, NP  doxycycline  (VIBRAMYCIN ) 100 MG capsule Take 1  capsule (100 mg total) by mouth 2 (two) times daily. 02/06/24  Yes Johnie, Voshon Petro A, NP  promethazine-dextromethorphan  (PROMETHAZINE-DM) 6.25-15 MG/5ML syrup Take 5 mLs by mouth at bedtime as needed for cough. 02/06/24  Yes Johnie, Sheyna Pettibone A, NP  amLODipine  (NORVASC ) 10 MG tablet Take 1 tablet (10 mg total) by mouth daily. 06/05/20   Shamleffer, Ibtehal Jaralla, MD  losartan  (COZAAR ) 50 MG tablet Take 1 tablet (50 mg total) by mouth daily. 08/16/23 08/15/24  Ward, Josette SAILOR, DO  naproxen  (NAPROSYN ) 500 MG tablet Take 1 tablet (500 mg total) by mouth 2 (two) times daily as needed. 11/04/23   Rancour, Garnette, MD    Family History Family History  Problem Relation Age of Onset   Bladder Cancer Neg Hx    Kidney cancer Neg Hx    Prostate cancer Neg Hx     Social History Social History   Tobacco Use   Smoking status: Never   Smokeless tobacco: Never  Vaping Use   Vaping status: Never Used  Substance Use Topics   Alcohol use: No   Drug use: No     Allergies   Augmentin [amoxicillin-pot clavulanate]   Review of Systems Review of Systems  HENT:  Positive for ear pain.    Per HPI  Physical Exam Triage Vital Signs ED Triage Vitals  Encounter Vitals Group     BP 02/06/24 1301 (!) 150/97     Girls Systolic BP Percentile --  Girls Diastolic BP Percentile --      Boys Systolic BP Percentile --      Boys Diastolic BP Percentile --      Pulse Rate 02/06/24 1301 80     Resp 02/06/24 1301 18     Temp 02/06/24 1301 98.8 F (37.1 C)     Temp Source 02/06/24 1301 Oral     SpO2 02/06/24 1301 96 %     Weight --      Height --      Head Circumference --      Peak Flow --      Pain Score 02/06/24 1258 4     Pain Loc --      Pain Education --      Exclude from Growth Chart --    No data found.  Updated Vital Signs BP (!) 150/97 (BP Location: Left Arm)   Pulse 80   Temp 98.8 F (37.1 C) (Oral)   Resp 18   SpO2 96%   Visual Acuity Right Eye Distance:   Left Eye  Distance:   Bilateral Distance:    Right Eye Near:   Left Eye Near:    Bilateral Near:     Physical Exam Vitals and nursing note reviewed.  Constitutional:      General: He is awake. He is not in acute distress.    Appearance: Normal appearance. He is well-developed and well-groomed. He is not ill-appearing.  HENT:     Right Ear: Ear canal and external ear normal. Tympanic membrane is erythematous.     Left Ear: Ear canal and external ear normal. Tympanic membrane is erythematous.     Nose: Congestion and rhinorrhea present.     Right Sinus: Maxillary sinus tenderness present.     Left Sinus: Maxillary sinus tenderness present.     Mouth/Throat:     Mouth: Mucous membranes are moist.     Pharynx: Posterior oropharyngeal erythema and postnasal drip present. No oropharyngeal exudate.  Cardiovascular:     Rate and Rhythm: Normal rate and regular rhythm.  Pulmonary:     Effort: Pulmonary effort is normal.     Breath sounds: Normal breath sounds.  Skin:    General: Skin is warm and dry.  Neurological:     Mental Status: He is alert.  Psychiatric:        Behavior: Behavior is cooperative.      UC Treatments / Results  Labs (all labs ordered are listed, but only abnormal results are displayed) Labs Reviewed  POCT RAPID STREP A (OFFICE)    EKG   Radiology No results found.  Procedures Procedures (including critical care time)  Medications Ordered in UC Medications - No data to display  Initial Impression / Assessment and Plan / UC Course  I have reviewed the triage vital signs and the nursing notes.  Pertinent labs & imaging results that were available during my care of the patient were reviewed by me and considered in my medical decision making (see chart for details).     Patient is overall well-appearing.  Vitals are stable.  Rapid strep was negative, deferred sending culture due to presentation being inconsistent with this.   Exam findings consistent with  bacterial sinusitis.  Prescribed doxycycline  for coverage of bacterial sinusitis.  Prescribed azelastine  nasal spray to help with nasal congestion and sinus pressure.  Prescribed Promethazine  DM cough syrup for cough at bedtime.  Discussed over-the-counter medications for symptoms.  Discussed follow-up and return precautions. Final  Clinical Impressions(s) / UC Diagnoses   Final diagnoses:  Sore throat  Acute bacterial sinusitis  Acute cough     Discharge Instructions      Start taking doxycycline  twice daily for 10 days for bacterial sinusitis. You can use azelastine nasal spray twice daily to help with nasal congestion and sinus pressure. You can take Promethazine DM cough syrup at bedtime as needed for cough.  This can make you drowsy so do not drive, work, drink alcohol, or take this with NyQuil. You can continue to take DayQuil during the day for your symptoms if needed. Follow-up with your primary care provider or return here as needed.   ED Prescriptions     Medication Sig Dispense Auth. Provider   doxycycline  (VIBRAMYCIN ) 100 MG capsule Take 1 capsule (100 mg total) by mouth 2 (two) times daily. 20 capsule Johnie, Gennavieve Huq A, NP   azelastine (ASTELIN) 0.1 % nasal spray Place 2 sprays into both nostrils 2 (two) times daily. Use in each nostril as directed 30 mL Johnie Flaming A, NP   promethazine-dextromethorphan  (PROMETHAZINE-DM) 6.25-15 MG/5ML syrup Take 5 mLs by mouth at bedtime as needed for cough. 118 mL Johnie Flaming A, NP      PDMP not reviewed this encounter.   Johnie Flaming A, NP 02/06/24 1348

## 2024-02-06 NOTE — Discharge Instructions (Signed)
 Start taking doxycycline  twice daily for 10 days for bacterial sinusitis. You can use azelastine nasal spray twice daily to help with nasal congestion and sinus pressure. You can take Promethazine DM cough syrup at bedtime as needed for cough.  This can make you drowsy so do not drive, work, drink alcohol, or take this with NyQuil. You can continue to take DayQuil during the day for your symptoms if needed. Follow-up with your primary care provider or return here as needed.

## 2024-02-06 NOTE — ED Triage Notes (Addendum)
 Patient start with a sore throat on Saturday and was having night sweats.  Patient is having ear pain and a throat pain.  Patient is having trouble breathing.  Patient can not smell or taste and has a headache  Patient took cough medication and tylenol 

## 2024-02-18 ENCOUNTER — Emergency Department (HOSPITAL_BASED_OUTPATIENT_CLINIC_OR_DEPARTMENT_OTHER)
Admission: EM | Admit: 2024-02-18 | Discharge: 2024-02-18 | Disposition: A | Discharge status: Home / Self Care | Attending: Emergency Medicine | Admitting: Emergency Medicine

## 2024-02-18 ENCOUNTER — Encounter (HOSPITAL_BASED_OUTPATIENT_CLINIC_OR_DEPARTMENT_OTHER): Payer: Self-pay | Admitting: Emergency Medicine

## 2024-02-18 ENCOUNTER — Emergency Department (HOSPITAL_BASED_OUTPATIENT_CLINIC_OR_DEPARTMENT_OTHER): Admitting: Radiology

## 2024-02-18 ENCOUNTER — Other Ambulatory Visit: Payer: Self-pay

## 2024-02-18 DIAGNOSIS — R0981 Nasal congestion: Secondary | ICD-10-CM | POA: Diagnosis present

## 2024-02-18 DIAGNOSIS — R509 Fever, unspecified: Secondary | ICD-10-CM | POA: Diagnosis not present

## 2024-02-18 DIAGNOSIS — R6889 Other general symptoms and signs: Secondary | ICD-10-CM | POA: Diagnosis not present

## 2024-02-18 DIAGNOSIS — E119 Type 2 diabetes mellitus without complications: Secondary | ICD-10-CM | POA: Diagnosis not present

## 2024-02-18 DIAGNOSIS — R059 Cough, unspecified: Secondary | ICD-10-CM | POA: Diagnosis not present

## 2024-02-18 LAB — RESP PANEL BY RT-PCR (RSV, FLU A&B, COVID)  RVPGX2
Influenza A by PCR: NEGATIVE
Influenza B by PCR: NEGATIVE
Resp Syncytial Virus by PCR: NEGATIVE
SARS Coronavirus 2 by RT PCR: NEGATIVE

## 2024-02-18 MED ORDER — ACETAMINOPHEN 500 MG PO TABS
1000.0000 mg | ORAL_TABLET | Freq: Once | ORAL | Status: AC
  Administered 2024-02-18: 1000 mg via ORAL
  Filled 2024-02-18: qty 2

## 2024-02-18 NOTE — ED Provider Notes (Signed)
 [place] EM[facility] DEPARTMENT AT [facility] Provider Note   CSN: [id number] Arrival date & time: 02/18/24  1046     Patient presents with: Cough   Kevin Sanders is a 23 y.o. male.   Patient with no significant medical history except for followed for type 2 diabetes/elevated A1c presents with cough congestion body aches for 2 days.  Patient trying over-the-counter meds without significant improvement.  Tylenol  prior arrival.  Patient had contacts with flu.  The history is provided by the patient.  Cough Associated symptoms: chills and fever   Associated symptoms: no chest pain, no headaches, no rash and no shortness of breath        Prior to Admission medications   Medication Sig Start Date End Date Taking? Authorizing Provider  amLODipine  (NORVASC ) 10 MG tablet Take 1 tablet (10 mg total) by mouth daily. 06/05/20   Shamleffer, Ibtehal Jaralla, MD  azelastine  (ASTELIN ) 0.1 % nasal spray Place 2 sprays into both nostrils 2 (two) times daily. Use in each nostril as directed 02/06/24   Johnie Flaming A, NP  doxycycline  (VIBRAMYCIN ) 100 MG capsule Take 1 capsule (100 mg total) by mouth 2 (two) times daily. 02/06/24   Johnie Flaming A, NP  losartan  (COZAAR ) 50 MG tablet Take 1 tablet (50 mg total) by mouth daily. 08/16/23 08/15/24  Ward, Josette SAILOR, DO  naproxen  (NAPROSYN ) 500 MG tablet Take 1 tablet (500 mg total) by mouth 2 (two) times daily as needed. 11/04/23   Rancour, Garnette, MD  promethazine -dextromethorphan  (PROMETHAZINE -DM) 6.25-15 MG/5ML syrup Take 5 mLs by mouth at bedtime as needed for cough. 02/06/24   Johnie Flaming A, NP    Allergies: Augmentin [amoxicillin-pot clavulanate]    Review of Systems  Constitutional:  Positive for appetite change, chills and fever.  HENT:  Positive for congestion.   Eyes:  Negative for visual disturbance.  Respiratory:  Positive for cough. Negative for shortness of breath.   Cardiovascular:  Negative for chest pain.   Gastrointestinal:  Negative for abdominal pain and vomiting.  Genitourinary:  Negative for dysuria and flank pain.  Musculoskeletal:  Negative for back pain, neck pain and neck stiffness.  Skin:  Negative for rash.  Neurological:  Negative for light-headedness and headaches.    Updated Vital Signs BP (!) 147/83   Pulse (!) 102   Temp 98 F (36.7 C)   Resp 20   Wt 104.3 kg   SpO2 96%   BMI 33.97 kg/m   Physical Exam Vitals and nursing note reviewed.  Constitutional:      General: He is not in acute distress.    Appearance: He is well-developed.  HENT:     Head: Normocephalic and atraumatic.     Nose: Congestion present.     Mouth/Throat:     Mouth: Mucous membranes are moist.  Eyes:     General:        Right eye: No discharge.        Left eye: No discharge.     Conjunctiva/sclera: Conjunctivae normal.  Neck:     Trachea: No tracheal deviation.  Cardiovascular:     Rate and Rhythm: Regular rhythm. Tachycardia present.  Pulmonary:     Effort: Pulmonary effort is normal.     Breath sounds: Normal breath sounds.  Abdominal:     General: There is no distension.     Palpations: Abdomen is soft.     Tenderness: There is no abdominal tenderness. There is no guarding.  Musculoskeletal:  Cervical back: Normal range of motion and neck supple. No rigidity.  Skin:    General: Skin is warm.     Capillary Refill: Capillary refill takes less than 2 seconds.     Findings: No rash.  Neurological:     General: No focal deficit present.     Mental Status: He is alert.     Cranial Nerves: No cranial nerve deficit.  Psychiatric:        Mood and Affect: Mood normal.     (all labs ordered are listed, but only abnormal results are displayed) Labs Reviewed  RESP PANEL BY RT-PCR (RSV, FLU A&B, COVID)  [id number]    EKG: None  Radiology: DG Chest 2 View Result Date: 02/18/2024 CLINICAL DATA:  Worsening cough, congestion and body aches x2 days. EXAM: CHEST - 2 VIEW  COMPARISON:  November 05, 2019 FINDINGS: The heart size and mediastinal contours are within normal limits. Both lungs are clear. The visualized skeletal structures are unremarkable. IMPRESSION: No active cardiopulmonary disease. Electronically Signed   By: Suzen Dials M.D.   On: 02/18/2024 11:50     Procedures   Medications Ordered in the ED  acetaminophen  (TYLENOL ) tablet 1,000 mg (1,000 mg Oral Given 02/18/24 1223)                                    Medical Decision Making Amount and/or Complexity of Data Reviewed Radiology: ordered.  Risk OTC drugs.   Patient presents with flulike illness likely viral in origin other differentials include atypical or typical pneumonia.  Chest x-ray ordered independently reviewed no infiltrate.  Vital signs overall reassuring not requiring oxygen mild tachycardia initially.  Viral test negative flu and negative COVID.  Patient stable for discharge work note given outpatient follow-up.     Final diagnoses:  Flu-like symptoms    ED Discharge Orders     None          Tonia Chew, MD 02/18/24 1409

## 2024-02-18 NOTE — ED Notes (Signed)
 Reviewed AVS/discharge instruction with patient. Time allotted for and all questions answered. Patient is agreeable for d/c and escorted to ed exit by staff.

## 2024-02-18 NOTE — Discharge Instructions (Signed)
 Use tylenol  and motrin  as needed for aches and fevers. Return for shortness of breath and new concerns.

## 2024-02-18 NOTE — ED Triage Notes (Signed)
 Pt reports feeling bad since Thanksgiving. Reports worsening  cough, congestion, body aches x 2 days, taking otc meds with no relief. Also reports RT ear pain.Tylenol  pta
